# Patient Record
Sex: Female | Born: 1960 | Race: White | Hispanic: No | Marital: Married | Smoking: Never smoker
Health system: Southern US, Community
[De-identification: ages and names within clinical notes are randomized; demographics above are authoritative.]

## PROBLEM LIST (undated history)

## (undated) DIAGNOSIS — F329 Major depressive disorder, single episode, unspecified: Secondary | ICD-10-CM

## (undated) DIAGNOSIS — E785 Hyperlipidemia, unspecified: Secondary | ICD-10-CM

## (undated) DIAGNOSIS — F32A Depression, unspecified: Secondary | ICD-10-CM

## (undated) DIAGNOSIS — I10 Essential (primary) hypertension: Secondary | ICD-10-CM

## (undated) DIAGNOSIS — K7581 Nonalcoholic steatohepatitis (NASH): Secondary | ICD-10-CM

---

## 1998-11-27 ENCOUNTER — Other Ambulatory Visit: Admission: RE | Admit: 1998-11-27 | Discharge: 1998-11-27 | Payer: Self-pay | Admitting: Obstetrics and Gynecology

## 2000-06-06 ENCOUNTER — Other Ambulatory Visit: Admission: RE | Admit: 2000-06-06 | Discharge: 2000-06-06 | Payer: Self-pay | Admitting: Obstetrics and Gynecology

## 2000-08-29 ENCOUNTER — Inpatient Hospital Stay (HOSPITAL_COMMUNITY): Admission: RE | Admit: 2000-08-29 | Discharge: 2000-08-31 | Payer: Self-pay | Admitting: Obstetrics and Gynecology

## 2000-08-29 ENCOUNTER — Encounter (INDEPENDENT_AMBULATORY_CARE_PROVIDER_SITE_OTHER): Payer: Self-pay

## 2001-06-20 ENCOUNTER — Emergency Department (HOSPITAL_COMMUNITY): Admission: EM | Admit: 2001-06-20 | Discharge: 2001-06-20 | Payer: Self-pay | Admitting: *Deleted

## 2001-12-22 ENCOUNTER — Encounter: Admission: RE | Admit: 2001-12-22 | Discharge: 2001-12-22 | Payer: Self-pay | Admitting: Family Medicine

## 2001-12-22 ENCOUNTER — Encounter: Payer: Self-pay | Admitting: Family Medicine

## 2002-01-07 ENCOUNTER — Other Ambulatory Visit: Admission: RE | Admit: 2002-01-07 | Discharge: 2002-01-07 | Payer: Self-pay | Admitting: Pediatrics

## 2002-02-04 ENCOUNTER — Encounter: Admission: RE | Admit: 2002-02-04 | Discharge: 2002-02-04 | Payer: Self-pay | Admitting: Family Medicine

## 2002-02-04 ENCOUNTER — Encounter: Payer: Self-pay | Admitting: Family Medicine

## 2003-02-15 ENCOUNTER — Other Ambulatory Visit: Admission: RE | Admit: 2003-02-15 | Discharge: 2003-02-15 | Payer: Self-pay | Admitting: Obstetrics and Gynecology

## 2004-08-30 ENCOUNTER — Ambulatory Visit (HOSPITAL_COMMUNITY): Admission: RE | Admit: 2004-08-30 | Discharge: 2004-08-30 | Payer: Self-pay | Admitting: Orthopedic Surgery

## 2004-08-30 ENCOUNTER — Ambulatory Visit (HOSPITAL_BASED_OUTPATIENT_CLINIC_OR_DEPARTMENT_OTHER): Admission: RE | Admit: 2004-08-30 | Discharge: 2004-08-30 | Payer: Self-pay | Admitting: Orthopedic Surgery

## 2005-09-03 ENCOUNTER — Other Ambulatory Visit: Admission: RE | Admit: 2005-09-03 | Discharge: 2005-09-03 | Payer: Self-pay | Admitting: Obstetrics and Gynecology

## 2005-10-15 ENCOUNTER — Ambulatory Visit: Payer: Self-pay | Admitting: Internal Medicine

## 2005-10-17 ENCOUNTER — Encounter: Admission: RE | Admit: 2005-10-17 | Discharge: 2005-10-17 | Payer: Self-pay | Admitting: Family Medicine

## 2006-01-16 ENCOUNTER — Encounter: Admission: RE | Admit: 2006-01-16 | Discharge: 2006-01-16 | Payer: Self-pay | Admitting: Family Medicine

## 2008-04-22 ENCOUNTER — Ambulatory Visit: Payer: Self-pay | Admitting: Internal Medicine

## 2008-04-29 ENCOUNTER — Telehealth (INDEPENDENT_AMBULATORY_CARE_PROVIDER_SITE_OTHER): Payer: Self-pay | Admitting: *Deleted

## 2008-06-07 ENCOUNTER — Telehealth: Payer: Self-pay | Admitting: Internal Medicine

## 2009-04-27 ENCOUNTER — Ambulatory Visit (HOSPITAL_BASED_OUTPATIENT_CLINIC_OR_DEPARTMENT_OTHER): Admission: RE | Admit: 2009-04-27 | Discharge: 2009-04-27 | Payer: Self-pay | Admitting: Orthopedic Surgery

## 2010-01-22 ENCOUNTER — Ambulatory Visit (HOSPITAL_COMMUNITY): Admission: RE | Admit: 2010-01-22 | Discharge: 2010-01-22 | Payer: Self-pay | Admitting: Obstetrics and Gynecology

## 2010-03-16 ENCOUNTER — Ambulatory Visit (HOSPITAL_COMMUNITY): Admission: RE | Admit: 2010-03-16 | Discharge: 2010-03-17 | Payer: Self-pay | Admitting: Obstetrics and Gynecology

## 2011-03-15 LAB — DIFFERENTIAL
Basophils Absolute: 0 10*3/uL (ref 0.0–0.1)
Basophils Relative: 1 % (ref 0–1)
Eosinophils Relative: 1 % (ref 0–5)
Lymphocytes Relative: 31 % (ref 12–46)
Monocytes Absolute: 0.6 10*3/uL (ref 0.1–1.0)
Neutro Abs: 4.1 10*3/uL (ref 1.7–7.7)

## 2011-03-15 LAB — CBC
Hemoglobin: 13.5 g/dL (ref 12.0–15.0)
MCHC: 34.4 g/dL (ref 30.0–36.0)
Platelets: 265 10*3/uL (ref 150–400)
Platelets: 334 10*3/uL (ref 150–400)
RBC: 3.64 MIL/uL — ABNORMAL LOW (ref 3.87–5.11)
RDW: 12 % (ref 11.5–15.5)
WBC: 8.8 10*3/uL (ref 4.0–10.5)

## 2011-03-15 LAB — BASIC METABOLIC PANEL
BUN: 13 mg/dL (ref 6–23)
Chloride: 104 mEq/L (ref 96–112)
Creatinine, Ser: 0.89 mg/dL (ref 0.4–1.2)
GFR calc Af Amer: >60 mL/min (ref >60)
GFR calc non Af Amer: >60 mL/min (ref >60)
Potassium: 3.9 mEq/L (ref 3.5–5.1)

## 2011-04-02 LAB — BASIC METABOLIC PANEL
CO2: 27 mEq/L (ref 19–32)
Chloride: 101 mEq/L (ref 96–112)
GFR calc Af Amer: >60 mL/min (ref >60)
Potassium: 3.7 mEq/L (ref 3.5–5.1)

## 2011-04-02 LAB — POCT HEMOGLOBIN-HEMACUE: Hemoglobin: 14.5 g/dL (ref 12.0–15.0)

## 2011-05-07 NOTE — Op Note (Signed)
Sherri Lynch, Sherri Lynch              ACCOUNT NO.:  [id number]   MEDICAL RECORD NO.:  [id number]          PATIENT TYPE:  AMB   LOCATION:  DSC                          FACILITY:  [facility]   PHYSICIAN:  Loreta Ave, M.D. DATE OF BIRTH:  January 01, 1961   DATE OF PROCEDURE:  04/27/2009  DATE OF DISCHARGE:                               OPERATIVE REPORT   PREOPERATIVE DIAGNOSES:  Right shoulder impingement, labral tear, labral  cyst, distal clavicle osteolysis.   POSTOPERATIVE DIAGNOSES:  1. Right shoulder impingement, labral tear, labral cyst, distal      clavicle osteolysis with a moderate amount of partial tearing      biceps tendon origin, undersurface cuff.  2. Also some grade 2, 3, and focal grade 4 changes, inferior glenoid.   PROCEDURES:  1. Right shoulder exam under anesthesia, arthroscopy, debridement of      labrum tear, biceps, rotator cuff glenohumeral joint, articular      cartilage, loose bodies.  2. Bursectomy, acromioplasty, coracoacromial ligament release.  3. Excision of distal clavicle.   SURGEON:  Loreta Ave, MD   ASSISTANT:  Genene Churn. Barry Dienes, PA   ANESTHESIA:  General.   ESTIMATED BLOOD LOSS:  Minimal.   SPECIMENS:  None.   CULTURES:  None.   COMPLICATIONS:  None.   DRESSING:  Sterile compressive with sling.   PROCEDURE:  The patient brought to the operating room, placed on the  operating table in supine position.  After adequate anesthesia had been  obtained, shoulder examined.  Full motion with stable shoulder.  Placed  in beach-chair position on the shoulder positioner, prepped and draped  in usual sterile fashion.  Three portals created in anterior, posterior,  and lateral.  Shoulder entered with blunt obturator.  Arthroscope  introduced.  Shoulder distended and inspected.  A lot more labral  tearing than the scan suggested.  Extensive complex tearing of the  entire labrum all way around.  Debrided, leaving about a half of labrum  at completion.   At the top of the tear, when I entered the biceps origin  with about third of the tendon torn.  This was cleaned out and debrided.  I still had reasonable tendon left and a good attachment and I did not  feel resection or tenodesis indicated now.  Humeral head looked good.  Partial tearing in the crescent region of the cuff debrided.  Grade 3  and 4 changes on the glenoid, mostly it is grade 2 and 3.  Inferior  anterior.  All of this was cleaned out.  All loose bodies removed.  When  that was completed, cannula redirected subacromially.  Marked reactive  bursitis.  Impingement from spurring, grade 4 changes AC joint as well  as anterior acromion.  Bursa resected, cuff debrided.  CA ligament  released with cautery.  Acromioplasty to a type 1 acromion.  Distal  clavicle exposed.  Periarticular spurs with lateral centimeter of  clavicle resected.  At completion, adequacy of decompression confirmed  viewing from all portals.  Instruments and fluid removed.  Portals of  shoulder and bursa injected with Marcaine.  Portals closed with 4-0  nylon.  Sterile compressive dressing applied.  Anesthesia reversed.  Brought to the recovery room.  Tolerated the surgery well.  No  complications.      Loreta Ave, M.D.  Electronically Signed     DFM/MEDQ  D:  04/27/2009  T:  04/28/2009  Job:  [id number]

## 2011-05-10 NOTE — Discharge Summary (Signed)
[facility]  Patient:    Sherri Lynch, Sherri Lynch                     MRN: [id number] Adm. Date:  [id number] Disc. Date: [id number] Attending:  Frederich Balding                           Discharge Summary  ADMISSION DIAGNOSES:          1. 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                               2. Anatomical stress urinary incontinence.  DISCHARGE DIAGNOSES:          1. 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                               2. Anatomical stress urinary incontinence.  PROCEDURES:                   1. Total abdominal hysterectomy.                               2. Culdoplasty.                               3. Retropubic suspension of the bladder using the Burch procedure.                               4. Cystostomy and placement of suprapubic catheter.  HISTORY                       For complete History & Physical, please dictated note.  COURSE IN HOSPITAL:           The patient underwent the above-noted surgery without complications.  Postoperative hemoglobin was 10.9.  She began cramping on her first postop day.  Residuals were under 50 cc.  Subsequently, on her second postop day, the suprapubic catheter was removed.  She had normal voiding function at that time.  Abdomen was soft and nontender.  Bowel sounds were active.  She was passing flatus.  Incision was clear.  She had no active vaginal bleeding.  COMPLICATIONS:                None occurred during her stay in the hospital.  CONDITION ON DISCHARGE:        The patient is discharged home on her second postop day in good condition.  DISPOSITION:                  The patient is to watch for signs of infection, nausea, vomiting.  She will report increasing abdominal or pelvic pain.  She will also report any active vaginal bleeding.  She  is to avoid heavy lifting, vaginal entry, or  driving a car.  DISCHARGE MEDICATIONS:        Tylox as needed for pain, continue on iron sulfate supplementation.  FOLLOWUP:                     Reassess in the office in one week. DD:  08/31/00 TD:  09/01/00 Job: [id number]

## 2011-05-10 NOTE — Op Note (Signed)
Sherri Lynch, Sherri Lynch                        ACCOUNT NO.:  [id number]   MEDICAL RECORD NO.:  [id number]                   PATIENT TYPE:  AMB   LOCATION:  DSC                                  FACILITY:  [facility]   PHYSICIAN:  Loreta Ave, M.D.              DATE OF BIRTH:  25-Aug-1961   DATE OF PROCEDURE:  08/30/2004  DATE OF DISCHARGE:                                 OPERATIVE REPORT   PREOPERATIVE DIAGNOSIS:  Nonunion fracture fifth metatarsal distal shaft,  right foot.   POSTOPERATIVE DIAGNOSIS:  Nonunion fracture fifth metatarsal distal shaft,  right foot.   OPERATIVE PROCEDURE:  Open reduction and internal fixation fifth metatarsal  nonunion with a cannulated 4.5 screw and cerclage of suture.   SURGEON:  Loreta Ave, M.D.   ASSISTANT:  Arlys John D. Petrarca, P.A.-C.   ANESTHESIA:  General.   ESTIMATED BLOOD LOSS:  Minimal.   TOURNIQUET TIME:  45 minutes.   SPECIMENS:  None.   CULTURES:  None.   COMPLICATIONS:  None.   DRESSINGS:  Soft compressive with a wooden shoe.   PROCEDURE:  The patient was brought to the operating room and placed on the  operating table in supine position.  After adequate anesthesia had been  obtained, a tourniquet was applied to the upper aspect of the right leg.  Prepped and draped in the usual sterile fashion.  Exsanguinated with  elevation of Esmarch and tourniquet inflated to 300 mmHg.  Fluoroscopic  guidance throughout.  Longitudinal incision over the fracture site, distal  shaft, right fifth metatarsal.  Subperiosteal exposure of the nonunion was  down of fibrous debris so that it could be approximated to anatomic  alignment with good bone on bone contact.  Once this was achieved, a  longitudinal incision at the base of the fifth metatarsal.  A guide-wire was  passed from the proximal end of the shaft across the shaft, across the  reduced fracture, and into the distal aspect.  Measured and then pre-drilled  for a 60 mm lag screw.   The screw was then passed down the shaft, across the  fracture, and well down into the head of the fifth metatarsal with excellent  capture, fixation, alignment, and compression clinically as well as on  fluoroscopic views.  Care taken not to enter the joint.  Once this was  complete, to augment stability, I did two cerclages of #1 Vicryl around the  spiral nonunion.  Once that was achieved, I had excellent fixation and  alignment confirmed visually and fluoroscopically.  Both wounds were  irrigated and closed with Vicryl and then nylon.  The margins of the wound  were injected with Marcaine.  Sterile compressive dressing applied.  Tourniquet deflated and removed.  Wooden shoe applied.  Anesthesia reversed.  Brought to the recovery room.  Tolerated the surgery well without  complications.  Loreta Ave, M.D.    DFM/MEDQ  D:  08/30/2004  T:  08/30/2004  Job:  [id number]

## 2011-05-10 NOTE — Op Note (Signed)
[facility]  Patient:    Sherri Lynch, Sherri Lynch                     MRN: [id number] Proc. Date: 08/29/00 Adm. Date:  [id number] Disc. Date: [id number] Attending:  Frederich Balding                           Operative Report  PREOPERATIVE DIAGNOSES:       1. Pelvic endometriosis and uterine adenomyosis.                               2. Anatomical stress urinary incontinence.  POSTOPERATIVE DIAGNOSES:      1. Pelvic endometriosis and uterine adenomyosis.                               2. Anatomical stress urinary incontinence.  OPERATION:                    1. Total abdominal hysterectomy with Burch                                  retropubic suspension of the bladder.                               2. Cystoscopy with placement of suprapubic                                  catheter.                               3. Culdoplasty.  SURGEON:                      Juluis Mire, M.D.  ASSISTANT:                    Trevor Iha, M.D.  ANESTHESIA:                   General endotracheal.  ESTIMATED BLOOD LOSS:         200-300 cc.  PACKS AND DRAINS:             None.  INTRAOPERATIVE BLOOD REPLACED:  None.  COMPLICATIONS:                None.  INDICATIONS:                  As dictated in the history and physical.  DESCRIPTION OF PROCEDURE:     Patient taken to the OR and placed in the supine position.  After a satisfactory level of general endotracheal anesthesia was obtained, the patient was placed in the dorsal lithotomy position using the Allen stirrups.  The abdomen, perineum, and vagina were prepped out with Betadine.  A three-way Foley was placed to straight drain.  The patient was then draped out for surgery.  A low transverse skin incision was made with a knife and carried through the subcutaneous tissue.  The fascia was incised sharply and the fascia extended laterally.  The  fascia was then taken off the muscle superiorly and inferiorly.   Rectus muscles were separated in the midline.  The peritoneum was entered sharply, and the incision of the peritoneum was extended both superiorly and inferiorly.  There was no evidence of injury to adjacent organs.  Palpation revealed the kidneys to be of normal size and shape.  The appendix was visualized and noted to be unremarkable. The liver was smooth and the gallbladder nonpalpable.  An OConnor-OSullivan retractor was put into place, and the bowel contents were packed superiorly out of the pelvic cavity.  The uterus was visualized, was upper limits of normal size.  The tubes and ovaries were unremarkable.  The left ovary had a small superficial implant of endometriosis that was cauterized.  There were numerous implants of endometriosis in the cul-de-sac area that seemed to be mainly superficial.  Next, the uterus was clamped on both sides with Kelly clamps and elevated.  The right round ligament was clamped, cut, and suture ligated with 0 Vicryl.  The right utero-ovarian pedicle was isolated, clamped, cut, and doubly ligated, first with a free tie of 0 Vicryl and then a suture ligature of 0 Vicryl.  Next, the bladder flap was developed.  The left round ligament was clamped, cut, and suture ligated with 0 Vicryl.  The left utero-ovarian pedicle was isolated and clamped, cut, and doubly ligated, first with a free tie of 0 Vicryl, then a suture ligature of 0 Vicryl.  The bladder was further dissected off the lower uterine and cervical segment.  The uterine vessels were skeletonized and clamped, cut, and suture ligated with 0 Vicryl. Using the clamp, cut, and tie technique with suture ligature of 0 Vicryl, the parametrium was serially separated from the sides of the uterus down to the vaginal angles.  The vaginal angles were then clamped and cut, the intervening vaginal mucosa was incised, and the uterus was passed off the operative field. The vaginal angles were secured with two suture  ligatures of 0 Vicryl.  The intervening vaginal mucosa was closed with interrupted figure-of-eights of 0 Vicryl.  Areas of the bladder flap that were bleeding were all controlled using right angles and free ties of 3-0 Vicryl.  We also did use cautery. Next, she did have a deep cul-de-sac.  The uterosacral ligaments were brought together in the midline with interrupted sutures of 0 Vicryl.  This was continued toward the rectum.  We therefore were able to obliterate the cul-de-sac and provide support against enteroceles.  The ureters appeared to be uninvolved in the operative procedure.  Next, the ovaries were elevated to the round ligaments with suture ligatures of 0 Vicryl.  The pelvic cavity was thoroughly irrigated, and hemostasis was excellent.  Urine output remained clear and adequate.  All packs and self-retaining retractor were removed.  The peritoneum was closed with a running suture of 2-0 Vicryl.  Attention was now turned to the retropubic suspension.  The space of Retzius was easily developed.  A gloved hand was placed in the vaginal vault with good elevation.  The urethrovesical angle was identified.  Two sutures of 2-0 Prolene were put in place on each side of the urethrovesical angle and secured to Coopers ligament.  Next, two sutures were placed out laterally to the first two, again using 0 Prolene.  Again, these were attached to Coopers ligament.  All these were secured with good elevation of the urethrovesical angle and the bladder.  The gloved hand was then taken out of  the vaginal vault.  We had good hemostasis.  It is noted that during the procedure, some venous sinuses were bleeding and were controlled with right angles and free ties of 3-0 Vicryl.  Next, the bladder was distended due to three-way Foley.  A cystotomy site was made in the bladder fundus, and the cystoscope was introduced.  Visualization revealed no evidence of suture in the bladder.  We did give the  patient indigo carmine, and blue dye was filling from both ureteral orifices bilaterally. Next, the cystoscope was removed.  The cystotomy was closed, first with a  suture ligature of 3-0 Vicryl closing the mucosa, then the muscularis was brought together with 3-0 chromic, and a third layer was brought together with 3-0 chromic.  The bladder was re-distended, and no leakage was noted.  A Bonnano catheter was put in place and secured to the skin.  At this point in time, muscle was reapproximated with 3-0 Vicryl, fascia closed with a running suture of 0 PDS.  Skin was closed with staples and Steri-Strips.  Sponge, instrument, and needle count was verified as correct by the circulating nurse x 2.  Suprapubic drainage was adequate.  The patient was taken out of the dorsal lithotomy position and was extubated, alert, and transferred to the recovery room in good condition. DD:  08/30/00 TD:  09/01/00 Job: [id number]

## 2011-05-10 NOTE — H&P (Signed)
[facility]  Patient:    Sherri Lynch, Sherri Lynch                       MRN: [id number] Adm. Date:  08/29/00 Attending:  Juluis Mire, M.D.                         History and Physical  HISTORY OF PRESENT ILLNESS:   Patient is a 50 year old gravida 5 para 4 abortion 1 married white female, presents for a total abdominal hysterectomy along with retropubic suspension of the bladder for management of symptomatic pelvic endometriosis and uterine adenomyosis.  She also does have anatomical stress urinary incontinence.  In relation to the present admission, patient has had longstanding history with menstrual disorders.  Her cycles at the present time occur on a regular basis, but she is having problems with increasing flow.  Periods will last anywhere from 7-12 days with clots.  She reports changing pads and tampons every 1-2 hours.  Her hemoglobin has been slightly lower in the past due to this significant flow.  Along with this, she has been having increasing pain and dysmenorrhea.  Over-the-counter agents have been unresponsive.  She had a previous laparoscopy done in 1997 with findings of pelvic endometriosis and uterine adenomyosis.  The option of hormonal agents have been somewhat contraindicated due to patients hypertensive disorder.  In view of the continued issues, she does present for total abdominal hysterectomy.  Patient has also been having trouble with increasing stress urinary incontinence.  Subsequent evaluation in the office revealed a positive Q-tip test.  She had normal bladder volume studies with no evidence of uninhibited contractions.  She did demonstrate leakage of urine with coughing while standing.  In view of these findings, she is to also undergo retropubic suspension of the bladder with placement of a suprapubic catheter under cystoscopy.  ALLERGIES:                    Patient is allergic to CECLOR and PENICILLIN.  MEDICATIONS:                   Paxil.  PAST MEDICAL HISTORY:         Usual childhood diseases without any significant sequela.  PREVIOUS SURGICAL HISTORY:    As noted, she had the previous diagnostic laparoscopy with laser standby in 1997 for evaluation of pelvic endometriosis.  OBSTETRICAL HISTORY:          She has had four spontaneous vaginal deliveries and one TAB.  FAMILY HISTORY:               Noncontributory.  SOCIAL HISTORY:               No tobacco or alcohol use.  REVIEW OF SYSTEMS:            Noncontributory.  PHYSICAL EXAMINATION:  VITAL SIGNS:                  Patient is afebrile, stable vital signs.  HEENT:                        Patient is normocephalic.  Pupils equal, round, and reactive to light and accommodation.  Extraocular movements are intact. Sclerae and conjunctivae were clear.  Oropharynx clear.  NECK:  Without thyromegaly.  BREASTS:                      No discrete masses.  LUNGS:                        Clear.  CARDIOVASCULAR:               Regular rhythm and rate, no murmurs or gallops.  ABDOMEN:                      Benign.  PELVIC:                       Normal external genitalia, vaginal mucosa is clear.  Cervix unremarkable.  Uterus normal size, shape, and contour. Moderate cul-de-sac tenderness and uterine tenderness.  Adnexa unremarkable.  EXTREMITIES:                  Trace edema.  NEUROLOGIC:                   Grossly within normal limits.  IMPRESSION:                   1. Symptomatic pelvic endometriosis and uterine                                  adenomyosis.                               2. Anatomical stress urinary incontinence.  PLAN:                         Patient to undergo total abdominal hysterectomy. The ovaries will be left in place.  She does understand about persistent pelvic pain and discomfort secondary to the remaining ovaries, and the possible need for surgical management later.  In terms of the bladder  issue, we discussed the success rate of 80-90%.  She does understand that there can be recurrent issues with urinary leakage, requiring further management later on.  She does understand the need for placement of the suprapubic catheter and resultant bladder retraining.  Long-term catheterization is possible.  There are rare cases where the bladder suspension has to be taken down so the patient can void.  The risks of surgery have been discussed, including anesthetic concerns; the risk of infection; the risk of hemorrhage that can necessitate transfusion, with the risk of AIDS or hepatitis; the risk of injury to adjacent organs, including bladder, bowel, or ureters that could require further exploratory surgery; the risk of deep venous thrombosis and pulmonary embolus.  Patient professed an understanding of indications and risks. DD:  08/29/00 TD:  08/29/00 Job: [id number]

## 2013-01-08 ENCOUNTER — Other Ambulatory Visit (HOSPITAL_COMMUNITY): Payer: Self-pay | Admitting: Obstetrics and Gynecology

## 2013-01-08 DIAGNOSIS — E041 Nontoxic single thyroid nodule: Secondary | ICD-10-CM

## 2013-01-08 DIAGNOSIS — Z09 Encounter for follow-up examination after completed treatment for conditions other than malignant neoplasm: Secondary | ICD-10-CM

## 2013-01-13 ENCOUNTER — Ambulatory Visit (HOSPITAL_COMMUNITY)
Admission: RE | Admit: 2013-01-13 | Discharge: 2013-01-13 | Disposition: A | Discharge status: Home / Self Care | Payer: BC Managed Care – PPO | Source: Ambulatory Visit | Attending: Obstetrics and Gynecology | Admitting: Obstetrics and Gynecology

## 2013-01-13 DIAGNOSIS — E041 Nontoxic single thyroid nodule: Secondary | ICD-10-CM

## 2013-01-13 DIAGNOSIS — Z09 Encounter for follow-up examination after completed treatment for conditions other than malignant neoplasm: Secondary | ICD-10-CM

## 2013-01-13 DIAGNOSIS — E042 Nontoxic multinodular goiter: Secondary | ICD-10-CM | POA: Insufficient documentation

## 2015-01-26 ENCOUNTER — Emergency Department (HOSPITAL_COMMUNITY): Payer: Medicaid Other

## 2015-01-26 ENCOUNTER — Inpatient Hospital Stay (HOSPITAL_COMMUNITY): Payer: Medicaid Other

## 2015-01-26 ENCOUNTER — Observation Stay (HOSPITAL_COMMUNITY)
Admission: EM | Admit: 2015-01-26 | Discharge: 2015-01-27 | Disposition: A | Discharge status: Other Acute Inpatient Hospital | Payer: Medicaid Other | Attending: Internal Medicine | Admitting: Internal Medicine

## 2015-01-26 ENCOUNTER — Encounter (HOSPITAL_COMMUNITY): Payer: Self-pay | Admitting: Emergency Medicine

## 2015-01-26 DIAGNOSIS — G43909 Migraine, unspecified, not intractable, without status migrainosus: Secondary | ICD-10-CM | POA: Diagnosis present

## 2015-01-26 DIAGNOSIS — K7581 Nonalcoholic steatohepatitis (NASH): Secondary | ICD-10-CM | POA: Insufficient documentation

## 2015-01-26 DIAGNOSIS — R519 Headache, unspecified: Secondary | ICD-10-CM

## 2015-01-26 DIAGNOSIS — I161 Hypertensive emergency: Secondary | ICD-10-CM | POA: Diagnosis present

## 2015-01-26 DIAGNOSIS — R4701 Aphasia: Secondary | ICD-10-CM | POA: Diagnosis not present

## 2015-01-26 DIAGNOSIS — R4182 Altered mental status, unspecified: Secondary | ICD-10-CM | POA: Diagnosis not present

## 2015-01-26 DIAGNOSIS — E785 Hyperlipidemia, unspecified: Secondary | ICD-10-CM | POA: Diagnosis not present

## 2015-01-26 DIAGNOSIS — R51 Headache: Secondary | ICD-10-CM

## 2015-01-26 DIAGNOSIS — I10 Essential (primary) hypertension: Secondary | ICD-10-CM | POA: Insufficient documentation

## 2015-01-26 DIAGNOSIS — G458 Other transient cerebral ischemic attacks and related syndromes: Secondary | ICD-10-CM

## 2015-01-26 DIAGNOSIS — G459 Transient cerebral ischemic attack, unspecified: Secondary | ICD-10-CM | POA: Diagnosis present

## 2015-01-26 HISTORY — DX: Essential (primary) hypertension: I10

## 2015-01-26 HISTORY — DX: Hyperlipidemia, unspecified: E78.5

## 2015-01-26 HISTORY — DX: Depression, unspecified: F32.A

## 2015-01-26 HISTORY — DX: Nonalcoholic steatohepatitis (NASH): K75.81

## 2015-01-26 HISTORY — DX: Major depressive disorder, single episode, unspecified: F32.9

## 2015-01-26 LAB — COMPREHENSIVE METABOLIC PANEL
ALK PHOS: 91 U/L (ref 39–117)
ALT: 23 U/L (ref 0–35)
ANION GAP: 11 (ref 5–15)
AST: 27 U/L (ref 0–37)
Albumin: 4.7 g/dL (ref 3.5–5.2)
BUN: 19 mg/dL (ref 6–23)
CHLORIDE: 100 mmol/L (ref 96–112)
CO2: 26 mmol/L (ref 19–32)
CREATININE: 0.87 mg/dL (ref 0.50–1.10)
Calcium: 9.6 mg/dL (ref 8.4–10.5)
GFR calc non Af Amer: 75 mL/min — ABNORMAL LOW (ref >90)
GFR, EST AFRICAN AMERICAN: 87 mL/min — AB (ref >90)
Glucose, Bld: 93 mg/dL (ref 70–99)
POTASSIUM: 3.5 mmol/L (ref 3.5–5.1)
SODIUM: 137 mmol/L (ref 135–145)
TOTAL PROTEIN: 7.7 g/dL (ref 6.0–8.3)
Total Bilirubin: 0.9 mg/dL (ref 0.3–1.2)

## 2015-01-26 LAB — CBC
HCT: 40.1 % (ref 36.0–46.0)
HEMOGLOBIN: 14.2 g/dL (ref 12.0–15.0)
MCH: 30.3 pg (ref 26.0–34.0)
MCHC: 35.4 g/dL (ref 30.0–36.0)
MCV: 85.5 fL (ref 78.0–100.0)
Platelets: 295 10*3/uL (ref 150–400)
RBC: 4.69 MIL/uL (ref 3.87–5.11)
RDW: 12.4 % (ref 11.5–15.5)
WBC: 9.9 10*3/uL (ref 4.0–10.5)

## 2015-01-26 LAB — I-STAT TROPONIN, ED: TROPONIN I, POC: 0 ng/mL (ref 0.00–0.08)

## 2015-01-26 LAB — URINALYSIS, ROUTINE W REFLEX MICROSCOPIC
BILIRUBIN URINE: NEGATIVE
Glucose, UA: NEGATIVE mg/dL
HGB URINE DIPSTICK: NEGATIVE
Ketones, ur: NEGATIVE mg/dL
Leukocytes, UA: NEGATIVE
Nitrite: NEGATIVE
PH: 7 (ref 5.0–8.0)
Protein, ur: NEGATIVE mg/dL
Specific Gravity, Urine: 1.008 (ref 1.005–1.030)
UROBILINOGEN UA: 0.2 mg/dL (ref 0.0–1.0)

## 2015-01-26 LAB — DIFFERENTIAL
Basophils Absolute: 0 10*3/uL (ref 0.0–0.1)
Basophils Relative: 0 % (ref 0–1)
Eosinophils Absolute: 0.2 10*3/uL (ref 0.0–0.7)
Eosinophils Relative: 2 % (ref 0–5)
LYMPHS ABS: 3.5 10*3/uL (ref 0.7–4.0)
Lymphocytes Relative: 35 % (ref 12–46)
Monocytes Absolute: 0.6 10*3/uL (ref 0.1–1.0)
Monocytes Relative: 6 % (ref 3–12)
Neutro Abs: 5.7 10*3/uL (ref 1.7–7.7)
Neutrophils Relative %: 57 % (ref 43–77)

## 2015-01-26 LAB — CBG MONITORING, ED: Glucose-Capillary: 100 mg/dL — ABNORMAL HIGH (ref 70–99)

## 2015-01-26 LAB — PROTIME-INR
INR: 0.97 (ref 0.00–1.49)
PROTHROMBIN TIME: 13 s (ref 11.6–15.2)

## 2015-01-26 LAB — APTT: aPTT: 28 seconds (ref 24–37)

## 2015-01-26 MED ORDER — ENOXAPARIN SODIUM 30 MG/0.3ML ~~LOC~~ SOLN
30.0000 mg | SUBCUTANEOUS | Status: DC
  Administered 2015-01-27: 30 mg via SUBCUTANEOUS
  Filled 2015-01-26: qty 0.3

## 2015-01-26 MED ORDER — MORPHINE SULFATE 2 MG/ML IJ SOLN
2.0000 mg | INTRAMUSCULAR | Status: DC | PRN
  Filled 2015-01-26: qty 2

## 2015-01-26 MED ORDER — CLEVIDIPINE BUTYRATE 0.5 MG/ML IV EMUL
1.0000 mg/h | INTRAVENOUS | Status: DC
  Filled 2015-01-26: qty 50

## 2015-01-26 MED ORDER — SENNOSIDES-DOCUSATE SODIUM 8.6-50 MG PO TABS
1.0000 | ORAL_TABLET | Freq: Every evening | ORAL | Status: DC | PRN
  Filled 2015-01-26: qty 1

## 2015-01-26 MED ORDER — KETOROLAC TROMETHAMINE 30 MG/ML IJ SOLN
30.0000 mg | Freq: Once | INTRAMUSCULAR | Status: AC
  Administered 2015-01-26: 30 mg via INTRAVENOUS
  Filled 2015-01-26: qty 1

## 2015-01-26 MED ORDER — STROKE: EARLY STAGES OF RECOVERY BOOK
Freq: Once | Status: AC
  Administered 2015-01-27: 06:00:00
  Filled 2015-01-26 (×2): qty 1

## 2015-01-26 MED ORDER — NICARDIPINE HCL IN NACL 20-0.86 MG/200ML-% IV SOLN
3.0000 mg/h | Freq: Once | INTRAVENOUS | Status: AC
  Administered 2015-01-26: 3 mg/h via INTRAVENOUS
  Filled 2015-01-26: qty 200

## 2015-01-26 NOTE — ED Notes (Signed)
Spoke with Dr. Alvester MorinNewton about pt being off of Cardene to go to floor. Dr. Alvester MorinNewton stated that pt can come off of drip but if systolic pressure goes above 180 then she is to be placed back on. [facility].

## 2015-01-26 NOTE — ED Notes (Addendum)
Below orders not completed by EW. 

## 2015-01-26 NOTE — ED Notes (Addendum)
Shakeena RN from 4N called to inform this RN that pts bed assignment may be switched per pt placement to keep from having to transfer pt more than once.

## 2015-01-26 NOTE — ED Provider Notes (Signed)
  Physical Exam  BP 187/115 mmHg  Pulse 73  Temp(Src) 97.9 F (36.6 C) (Oral)  Resp 17  Wt 170 lb (77.111 kg)  SpO2 96%  Physical Exam  ED Course  Procedures  [facility] Patient had acute onset of R sided weakness, numbness, trouble speaking. Symptoms improving. CT head showed no obvious bleed. Transferred to see neuro. Dr. Roseanne RenoStewart saw patient. Doesn't recommend TPA. Thinks likely TIA vs complex migraine vs hypertensive urgency. Patient on cardene drip and given migraine cocktail. Will admit.   Richardean Canalavid H Yony Roulston, MD 01/26/15 2016

## 2015-01-26 NOTE — ED Notes (Signed)
[facility] at bedside 

## 2015-01-26 NOTE — H&P (Signed)
Hospitalist Admission History and Physical  Patient name: Sherri Lynch Medical record number: [id number] Date of birth: Sep 19, 1961 Age: 54 y.o. Gender: female  Primary Care Provider: Willow Ora, MD  Chief Complaint: TIA, hypertensive emergency, migraine   History of Present Illness:This is a 54 y.o. year old female with significant past medical history of migraine, HTN, NAFLD presenting with TIA, hypertensive emergency, migraine. Pt states that she developed persistent headache earlier today. Pt states that she saw her PCP. BP was in 220s per pt. Pt states that she was given clonidine in office as well as Rx for ARB. Pt states that she had not filled Rx.  Later in the day, during dinner, she developed R sided weakness, HA and R sided hemiparesis. Pt states that she had a similar episode 1 month ago w/ sxs lasting approx 30 mins. Does report high dose NSAID use over the past month for MSK pain-R wrist.Pt  went to [facility]. Was evaluated as a code stroke and subsequently directed to [facility]. Neuro consulted on arrival. Felt not to be a tPa candidate. Deficits resolved by time of MD eval. Also BPs in 180s/110s.  Pt given migraine cocktail as well as started on cardene gtt. Head CT no acute abnormality. CBC, CMET WNL. Trop neg x 1. EKG NSR.   Assessment and Plan: Sherri Lynch is a 54 y.o. year old female presenting with TIA, hypertensive emergency, migraine   Active Problems:   TIA (transient ischemic attack)   Hypertensive emergency   Migraine headache   1- Hemiparesis  -transient  -DDx includes TIA, hypertensive emergency, complex miigraine -cont cardene gtt -s/p migraine cocktail  -pain control -TIA workup including MRI, MRA, 2D ECHO, carotid dopplers, risk stratification labs -ASA   2-Hypertensive Emergency  -Markedly elevated BPs on presentation w/ TIA sxs -head CT, trop, EKG WNL -cont cardene drip  -titrate to BPs around 160 (avoid >25% decrease than baseline as to avoid  hypoperfusion if at all possible)  3-Migraine -s/p migraine cocktail  -?complex migraine  -pain control -f/u neuro recs   FEN/GI: NPO pending bedside swallow eval  Prophylaxis: lovenox  Disposition: pending further evaluation  Code Status:Full Code    Patient Active Problem List   Diagnosis Date Noted  . TIA (transient ischemic attack) 01/26/2015  . Hypertensive emergency 01/26/2015  . Migraine headache 01/26/2015   Past Medical History: Past Medical History  Diagnosis Date  . Hypertension   . Hyperlipidemia   . Depression   . NASH (nonalcoholic steatohepatitis)     Past Surgical History: History reviewed. No pertinent past surgical history.  Social History: History   Social History  . Marital Status: Married    Spouse Name: N/A    Number of Children: N/A  . Years of Education: N/A   Social History Main Topics  . Smoking status: Never Smoker   . Smokeless tobacco: None  . Alcohol Use: No  . Drug Use: None  . Sexual Activity: None   Other Topics Concern  . None   Social History Narrative  . None    Family History: No family history on file.  Allergies: Allergies  Allergen Reactions  . Ceclor [Cefaclor]   . Penicillins     Current Facility-Administered Medications  Medication Dose Route Frequency Provider Last Rate Last Dose  .  stroke: mapping our early stages of recovery book   Does not apply Once Doree Albee, MD      . enoxaparin (LOVENOX) injection 30 mg  30 mg Subcutaneous  Q24H Doree AlbeeSteven Gor Vestal, MD      . senna-docusate (Senokot-S) tablet 1 tablet  1 tablet Oral QHS PRN Doree AlbeeSteven Shanoah Asbill, MD       Current Outpatient Prescriptions  Medication Sig Dispense Refill  . losartan-hydrochlorothiazide (HYZAAR) 50-12.5 MG per tablet Take 1 tablet by mouth daily.      Review Of Systems: 12 point ROS negative except as noted above in HPI.  Physical Exam: Filed Vitals:   01/26/15 1915  BP: 187/115  Pulse: 73  Temp:   Resp: 17    General: alert  and cooperative HEENT: PERRLA and extra ocular movement intact Heart: S1, S2 normal, no murmur, rub or gallop, regular rate and rhythm Lungs: clear to auscultation, no wheezes or rales and unlabored breathing Abdomen: abdomen is soft without significant tenderness, masses, organomegaly or guarding Extremities: extremities normal, atraumatic, no cyanosis or edema Skin:no rashes Neurology: normal without focal findings  Labs and Imaging: Lab Results  Component Value Date/Time   NA 137 01/26/2015 06:57 PM   K 3.5 01/26/2015 06:57 PM   CL 100 01/26/2015 06:57 PM   CO2 26 01/26/2015 06:57 PM   BUN 19 01/26/2015 06:57 PM   CREATININE 0.87 01/26/2015 06:57 PM   GLUCOSE 93 01/26/2015 06:57 PM   Lab Results  Component Value Date   WBC 9.9 01/26/2015   HGB 14.2 01/26/2015   HCT 40.1 01/26/2015   MCV 85.5 01/26/2015   PLT 295 01/26/2015    Ct Head (brain) Wo Contrast  01/26/2015   CLINICAL DATA:  Code stroke. Right-sided weakness, acute onset. Initial encounter.  EXAM: CT HEAD WITHOUT CONTRAST  TECHNIQUE: Contiguous axial images were obtained from the base of the skull through the vertex without intravenous contrast.  COMPARISON:  CT of the paranasal sinuses performed 10/17/2005  FINDINGS: There is no evidence of acute infarction, mass lesion, or intra- or extra-axial hemorrhage on CT.  Mild periventricular white matter change likely reflects small vessel ischemic microangiopathy.  The posterior fossa, including the cerebellum, brainstem and fourth ventricle, is within normal limits. The third and lateral ventricles, and basal ganglia are unremarkable in appearance. The cerebral hemispheres are symmetric in appearance, with normal gray-white differentiation. No mass effect or midline shift is seen.  There is no evidence of fracture; visualized osseous structures are unremarkable in appearance. The orbits are within normal limits. The paranasal sinuses and mastoid air cells are well-aerated. No  significant soft tissue abnormalities are seen.  IMPRESSION: 1. No acute intracranial pathology seen on CT. 2. Mild small vessel ischemic microangiopathy noted.  These results were called by telephone at the time of interpretation on 01/26/2015 at 6:57 pm to Dr. Lorre NickANTHONY ALLEN, who verbally acknowledged these results.   Electronically Signed   By: Roanna RaiderJeffery  Chang M.D.   On: 01/26/2015 18:58           Doree AlbeeSteven Seraphim Trow MD  Pager: [phone]

## 2015-01-26 NOTE — ED Notes (Signed)
Cleviprex order discontinued d/t delay in being sent from pharmacy. Dr. Freida BusmanAllen made aware and states that Cardene may be given, order placed and given.

## 2015-01-26 NOTE — ED Notes (Signed)
Pt was at dinner with her husband when she started having weakness, numbness to right side of face, slurred speech and confusion.  Pt was seen at PCP today had elevated BP, pt medications was switched.

## 2015-01-26 NOTE — ED Provider Notes (Signed)
CSN: [id number]     Arrival date & time 01/26/15  1830 History   First MD Initiated Contact with Patient 01/26/15 1838     Chief Complaint  Patient presents with  . Altered Mental Status  . Code Stroke  . Aphasia     (Consider location/radiation/quality/duration/timing/severity/associated sxs/prior Treatment) HPI Comments: Patient here complaining of acute onset of trouble speaking with right-sided upper and lower extremity weakness. She also was confused as well too with a mild headache. Went to her doctor today and had her blood pressure medication adjusted. No vomiting noted. Unable to ambulate without becoming off balance. Denies any chest pain or shortness of breath. No abdominal pain. No visual changes. Denies any neck pain. Symptoms persistent and no treatment use prior to arrival.  Patient is a 54 y.o. female presenting with altered mental status. The history is provided by the patient.  Altered Mental Status   Past Medical History  Diagnosis Date  . Hypertension   . Hyperlipidemia   . Depression   . NASH (nonalcoholic steatohepatitis)    History reviewed. No pertinent past surgical history. No family history on file. History  Substance Use Topics  . Smoking status: Never Smoker   . Smokeless tobacco: Not on file  . Alcohol Use: No   OB History    No data available     Review of Systems  All other systems reviewed and are negative.     Allergies  Ceclor and Penicillins  Home Medications   Prior to Admission medications   Medication Sig Start Date End Date Taking? Authorizing Provider  losartan-hydrochlorothiazide (HYZAAR) 50-12.5 MG per tablet Take 1 tablet by mouth daily.  01/26/15  Yes Historical Provider, MD   BP 200/120 mmHg  Pulse 91  Temp(Src) 97.5 F (36.4 C) (Oral)  Resp 16  SpO2 96% Physical Exam  Constitutional: She is oriented to person, place, and time. She appears well-developed and well-nourished.  Non-toxic appearance. No distress.   HENT:  Head: Normocephalic and atraumatic.  Eyes: Conjunctivae, EOM and lids are normal. Pupils are equal, round, and reactive to light.  Neck: Normal range of motion. Neck supple. No tracheal deviation present. No thyroid mass present.  Cardiovascular: Normal rate, regular rhythm and normal heart sounds.  Exam reveals no gallop.   No murmur heard. Pulmonary/Chest: Effort normal and breath sounds normal. No stridor. No respiratory distress. She has no decreased breath sounds. She has no wheezes. She has no rhonchi. She has no rales.  Abdominal: Soft. Normal appearance and bowel sounds are normal. She exhibits no distension. There is no tenderness. There is no rebound and no CVA tenderness.  Musculoskeletal: Normal range of motion. She exhibits no edema or tenderness.  Neurological: She is alert and oriented to person, place, and time. She has normal strength. A sensory deficit is present. No cranial nerve deficit. GCS eye subscore is 4. GCS verbal subscore is 5. GCS motor subscore is 6.  Subjective decreased sensation right upper right lower extremity. No facial symmetry noted. Speech is not slurred. Negative Romberg. No nystagmus noted. Strength normal in both upper extremities  Skin: Skin is warm and dry. No abrasion and no rash noted.  Psychiatric: Her speech is normal and behavior is normal. Her affect is blunt.  Nursing note and vitals reviewed.   ED Course  Procedures (including critical care time) Labs Review Labs Reviewed  PROTIME-INR  APTT  CBC  DIFFERENTIAL  COMPREHENSIVE METABOLIC PANEL  CBG MONITORING, ED  Rosezena SensorI-STAT TROPOININ, ED  Imaging Review No results found.   EKG Interpretation   Date/Time:  Thursday January 26 2015 18:37:42 EST Ventricular Rate:  89 PR Interval:  167 QRS Duration: 104 QT Interval:  405 QTC Calculation: 493 R Axis:   -32 Text Interpretation:  Sinus rhythm Incomplete RBBB and LAFB RSR' in V1 or  V2, right VCD or RVH Left ventricular  hypertrophy Borderline prolonged QT  interval No significant change since last tracing Confirmed by Leva Baine  MD,  Kaedon Fanelli ([id number]) on 01/26/2015 6:57:32 PM      MDM   Final diagnoses:  None    Patient was last seen normal was approximately 30 minutes prior to arrival. States that her symptoms are improving. With neurologist on call, Dr. Thad Ranger, patient be transferred to [facility]. Due to her improving symptoms she is not a TPA candidate at this time. Patient's NIH stroke scale was 2. Patient started on Cardene for her high blood pressure.  CRITICAL CARE Performed by: Toy Baker Total critical care time: 45 Critical care time was exclusive of separately billable procedures and treating other patients. Critical care was necessary to treat or prevent imminent or life-threatening deterioration. Critical care was time spent personally by me on the following activities: development of treatment plan with patient and/or surrogate as well as nursing, discussions with consultants, evaluation of patient's response to treatment, examination of patient, obtaining history from patient or surrogate, ordering and performing treatments and interventions, ordering and review of laboratory studies, ordering and review of radiographic studies, pulse oximetry and re-evaluation of patient's condition.    Toy Baker, MD 01/26/15 [phone]

## 2015-01-26 NOTE — Consult Note (Signed)
Referring Physician: Cheri Rous    Chief Complaint: Acute onset of speech difficulty with right-sided numbness and facial droop.  HPI: Sherri Lynch is an 54 y.o. female with a history of hypertension with noncompliance with taking medication, hyperlipidemial, Ehlers-Danlos syndrome, nonalcoholic steatohepatitis, and migraine headaches, who brought to the emergency room initially at [facility]. Blood pressure was noted to be markedly elevated at 219/162. Facial droop as well as speech difficulty were noted. NIH stroke score initially was 4. CT scan of her head showed no acute intracranial abnormality. Patient gives a history of similar episode several months ago right side visual changes and facial droop as well as speech difficulty with associated severe headache. She has a moderately severe headache with current symptoms. She was transferred to [facility] and code stroke status. Deficits subsequently resolved without intervention. NIH stroke score here was 0. Patient has required IV Cardene drip for management of hypertensive emergency.  LSN: 6 PM on 01/26/2015 tPA Given: No: Deficits resolved. mRankin:  Past Medical History  Diagnosis Date  . Hypertension   . Hyperlipidemia   . Depression   . NASH (nonalcoholic steatohepatitis)     No family history on file.   Medications: I have reviewed the patient's current medications.  ROS: History obtained from spouse and the patient  General ROS: negative for - chills, fatigue, fever, night sweats, weight gain or weight loss Psychological ROS: negative for - behavioral disorder, hallucinations, memory difficulties, mood swings or suicidal ideation Ophthalmic ROS: negative for - blurry vision, double vision, eye pain or loss of vision ENT ROS: negative for - epistaxis, nasal discharge, oral lesions, sore throat, tinnitus or vertigo Allergy and Immunology ROS: negative for - hives or itchy/watery eyes Hematological and  Lymphatic ROS: negative for - bleeding problems, bruising or swollen lymph nodes Endocrine ROS: negative for - galactorrhea, hair pattern changes, polydipsia/polyuria or temperature intolerance Respiratory ROS: negative for - cough, hemoptysis, shortness of breath or wheezing Cardiovascular ROS: negative for - chest pain, dyspnea on exertion, edema or irregular heartbeat Gastrointestinal ROS: negative for - abdominal pain, diarrhea, hematemesis, nausea/vomiting or stool incontinence Genito-Urinary ROS: negative for - dysuria, hematuria, incontinence or urinary frequency/urgency Musculoskeletal ROS: negative for - joint swelling or muscular weakness Neurological ROS: as noted in HPI Dermatological ROS: negative for rash and skin lesion changes  Physical Examination: Blood pressure 187/115, pulse 73, temperature 97.9 F (36.6 C), temperature source Oral, resp. rate 17, weight 77.111 kg (170 lb), SpO2 96 %. Appearance was that of a moderately overweight middle-aged appearing lady who is alert and in no acute distress except for complaint of headache. HEENT: Normal Neck was supple with normal range of motion and no tenderness. Heart rate and rhythm were normal. Heart sounds were normal. Extremities were normal in appearance with no edema and no discoloration.  Neurologic Examination: Mental Status: Alert, oriented, thought content appropriate.  Speech fluent without evidence of aphasia. Able to follow commands without difficulty. Cranial Nerves: II-Visual fields were normal. III/IV/VI-Pupils were equal and reacted. Extraocular movements were full and conjugate.    V/VII-no facial numbness and no facial weakness. VIII-normal. X-normal speech and symmetrical palatal movement. XII-midline tongue extension Motor: 5/5 bilaterally with normal tone and bulk Sensory: Normal throughout. Deep Tendon Reflexes: 2+ and symmetric. Plantars: Flexor bilaterally Cerebellar: Normal finger-to-nose  testing. Carotid auscultation: Normal  Ct Head (brain) Wo Contrast  01/26/2015   CLINICAL DATA:  Code stroke. Right-sided weakness, acute onset. Initial encounter.  EXAM: CT HEAD WITHOUT CONTRAST  TECHNIQUE: Contiguous axial images were obtained from the base of the skull through the vertex without intravenous contrast.  COMPARISON:  CT of the paranasal sinuses performed 10/17/2005  FINDINGS: There is no evidence of acute infarction, mass lesion, or intra- or extra-axial hemorrhage on CT.  Mild periventricular white matter change likely reflects small vessel ischemic microangiopathy.  The posterior fossa, including the cerebellum, brainstem and fourth ventricle, is within normal limits. The third and lateral ventricles, and basal ganglia are unremarkable in appearance. The cerebral hemispheres are symmetric in appearance, with normal gray-white differentiation. No mass effect or midline shift is seen.  There is no evidence of fracture; visualized osseous structures are unremarkable in appearance. The orbits are within normal limits. The paranasal sinuses and mastoid air cells are well-aerated. No significant soft tissue abnormalities are seen.  IMPRESSION: 1. No acute intracranial pathology seen on CT. 2. Mild small vessel ischemic microangiopathy noted.  These results were called by telephone at the time of interpretation on 01/26/2015 at 6:57 pm to Dr. Lorre NickANTHONY ALLEN, who verbally acknowledged these results.   Electronically Signed   By: Roanna RaiderJeffery  Chang M.D.   On: 01/26/2015 18:58    Assessment: 54 y.o. female with a history of hypertension and noncompliance with treatment presenting with recurrent transient right-sided deficits with associated headache. Deficits have resolved at this point. Complicated migraine headache is a strong possibility. However, a small MCA territory TIA or ischemic stroke cannot be ruled out at this point.  Stroke Risk Factors - hypertension  Plan: 1. HgbA1c, fasting lipid  panel 2. MRI, MRA  of the brain without contrast 3. PT consult, OT consult, Speech consult 4. Echocardiogram 5. Carotid dopplers 6. Prophylactic therapy-Antiplatelet med: Aspirin  7. Risk factor modification 8. Telemetry monitoring   C.R. Roseanne RenoStewart, MD [facility] [phone]  01/26/2015, 7:52 PM

## 2015-01-27 ENCOUNTER — Encounter (HOSPITAL_COMMUNITY): Payer: Self-pay | Admitting: Emergency Medicine

## 2015-01-27 ENCOUNTER — Emergency Department (HOSPITAL_COMMUNITY)
Admission: EM | Admit: 2015-01-27 | Discharge: 2015-01-28 | Disposition: A | Discharge status: Home / Self Care | Payer: Medicaid Other | Attending: Emergency Medicine | Admitting: Emergency Medicine

## 2015-01-27 DIAGNOSIS — F329 Major depressive disorder, single episode, unspecified: Secondary | ICD-10-CM | POA: Diagnosis not present

## 2015-01-27 DIAGNOSIS — I10 Essential (primary) hypertension: Secondary | ICD-10-CM | POA: Diagnosis not present

## 2015-01-27 DIAGNOSIS — G43009 Migraine without aura, not intractable, without status migrainosus: Secondary | ICD-10-CM

## 2015-01-27 DIAGNOSIS — Z880 Allergy status to penicillin: Secondary | ICD-10-CM | POA: Insufficient documentation

## 2015-01-27 DIAGNOSIS — Z7982 Long term (current) use of aspirin: Secondary | ICD-10-CM | POA: Insufficient documentation

## 2015-01-27 DIAGNOSIS — E785 Hyperlipidemia, unspecified: Secondary | ICD-10-CM

## 2015-01-27 DIAGNOSIS — Z79899 Other long term (current) drug therapy: Secondary | ICD-10-CM | POA: Diagnosis not present

## 2015-01-27 DIAGNOSIS — Z8719 Personal history of other diseases of the digestive system: Secondary | ICD-10-CM | POA: Insufficient documentation

## 2015-01-27 DIAGNOSIS — Z8639 Personal history of other endocrine, nutritional and metabolic disease: Secondary | ICD-10-CM | POA: Insufficient documentation

## 2015-01-27 DIAGNOSIS — G459 Transient cerebral ischemic attack, unspecified: Secondary | ICD-10-CM

## 2015-01-27 DIAGNOSIS — G43909 Migraine, unspecified, not intractable, without status migrainosus: Secondary | ICD-10-CM

## 2015-01-27 LAB — CBC
HCT: 35.2 % — ABNORMAL LOW (ref 36.0–46.0)
HCT: 36.5 % (ref 36.0–46.0)
HEMOGLOBIN: 12.8 g/dL (ref 12.0–15.0)
HEMOGLOBIN: 13.3 g/dL (ref 12.0–15.0)
MCH: 30 pg (ref 26.0–34.0)
MCH: 30 pg (ref 26.0–34.0)
MCHC: 36.4 g/dL — ABNORMAL HIGH (ref 30.0–36.0)
MCHC: 36.4 g/dL — AB (ref 30.0–36.0)
MCV: 82.2 fL (ref 78.0–100.0)
MCV: 82.4 fL (ref 78.0–100.0)
PLATELETS: 241 10*3/uL (ref 150–400)
PLATELETS: 283 10*3/uL (ref 150–400)
RBC: 4.27 MIL/uL (ref 3.87–5.11)
RBC: 4.44 MIL/uL (ref 3.87–5.11)
RDW: 12.2 % (ref 11.5–15.5)
RDW: 12.1 % (ref 11.5–15.5)
WBC: 9.2 10*3/uL (ref 4.0–10.5)
WBC: 9.6 10*3/uL (ref 4.0–10.5)

## 2015-01-27 LAB — COMPREHENSIVE METABOLIC PANEL
ALT: 18 U/L (ref 0–35)
AST: 21 U/L (ref 0–37)
Albumin: 3.9 g/dL (ref 3.5–5.2)
Alkaline Phosphatase: 79 U/L (ref 39–117)
Anion gap: 4 — ABNORMAL LOW (ref 5–15)
BUN: 15 mg/dL (ref 6–23)
CHLORIDE: 104 mmol/L (ref 96–112)
CO2: 30 mmol/L (ref 19–32)
Calcium: 9.4 mg/dL (ref 8.4–10.5)
Creatinine, Ser: 0.8 mg/dL (ref 0.50–1.10)
GFR calc non Af Amer: 83 mL/min — ABNORMAL LOW (ref >90)
GLUCOSE: 97 mg/dL (ref 70–99)
POTASSIUM: 3.2 mmol/L — AB (ref 3.5–5.1)
Sodium: 138 mmol/L (ref 135–145)
TOTAL PROTEIN: 6.4 g/dL (ref 6.0–8.3)
Total Bilirubin: 0.7 mg/dL (ref 0.3–1.2)

## 2015-01-27 LAB — CREATININE, SERUM
CREATININE: 0.8 mg/dL (ref 0.50–1.10)
GFR, EST NON AFRICAN AMERICAN: 83 mL/min — AB (ref >90)

## 2015-01-27 LAB — LIPID PANEL
Cholesterol: 215 mg/dL — ABNORMAL HIGH (ref 0–200)
HDL: 31 mg/dL — ABNORMAL LOW (ref >39)
LDL Cholesterol: 136 mg/dL — ABNORMAL HIGH (ref 0–99)
Total CHOL/HDL Ratio: 6.9 RATIO
Triglycerides: 241 mg/dL — ABNORMAL HIGH (ref <150)
VLDL: 48 mg/dL — AB (ref 0–40)

## 2015-01-27 LAB — MRSA PCR SCREENING: MRSA BY PCR: NEGATIVE

## 2015-01-27 MED ORDER — FENOFIBRATE 48 MG PO TABS: 48.0000 mg | ORAL_TABLET | Freq: Every day | ORAL | Status: AC

## 2015-01-27 MED ORDER — FENTANYL CITRATE 0.05 MG/ML IJ SOLN
25.0000 ug | Freq: Once | INTRAMUSCULAR | Status: AC
  Administered 2015-01-27: 25 ug via INTRAVENOUS
  Filled 2015-01-27: qty 2

## 2015-01-27 MED ORDER — METOPROLOL TARTRATE 25 MG PO TABS
25.0000 mg | ORAL_TABLET | Freq: Once | ORAL | Status: AC
  Administered 2015-01-28: 25 mg via ORAL
  Filled 2015-01-27: qty 1

## 2015-01-27 MED ORDER — SODIUM CHLORIDE 0.9 % IV SOLN
INTRAVENOUS | Status: AC
  Administered 2015-01-27: 01:00:00 via INTRAVENOUS

## 2015-01-27 MED ORDER — POTASSIUM CHLORIDE CRYS ER 20 MEQ PO TBCR
40.0000 meq | EXTENDED_RELEASE_TABLET | Freq: Once | ORAL | Status: AC
  Administered 2015-01-27: 40 meq via ORAL
  Filled 2015-01-27: qty 2

## 2015-01-27 MED ORDER — INFLUENZA VAC SPLIT QUAD 0.5 ML IM SUSY: 0.5000 mL | PREFILLED_SYRINGE | INTRAMUSCULAR | Status: DC

## 2015-01-27 MED ORDER — ASPIRIN 81 MG PO TBEC: 81.0000 mg | DELAYED_RELEASE_TABLET | Freq: Every day | ORAL | Status: AC

## 2015-01-27 MED ORDER — ASPIRIN EC 81 MG PO TBEC
81.0000 mg | DELAYED_RELEASE_TABLET | Freq: Every day | ORAL | Status: DC
  Administered 2015-01-27: 81 mg via ORAL
  Filled 2015-01-27: qty 1

## 2015-01-27 MED ORDER — METOPROLOL TARTRATE 1 MG/ML IV SOLN
2.5000 mg | Freq: Once | INTRAVENOUS | Status: AC
  Administered 2015-01-28: 2.5 mg via INTRAVENOUS
  Filled 2015-01-27: qty 5

## 2015-01-27 MED ORDER — METOPROLOL TARTRATE 25 MG PO TABS: 12.5000 mg | ORAL_TABLET | Freq: Two times a day (BID) | ORAL | Status: DC

## 2015-01-27 MED ORDER — METOCLOPRAMIDE HCL 5 MG/ML IJ SOLN
10.0000 mg | Freq: Once | INTRAMUSCULAR | Status: AC
  Administered 2015-01-27: 10 mg via INTRAVENOUS

## 2015-01-27 MED ORDER — ALPRAZOLAM 0.25 MG PO TABS: 0.2500 mg | ORAL_TABLET | Freq: Every evening | ORAL | Status: AC | PRN

## 2015-01-27 NOTE — Progress Notes (Signed)
Echocardiogram 2D Echocardiogram has been performed.  Sherri Lynch 01/27/2015, 12:17 PM

## 2015-01-27 NOTE — Progress Notes (Signed)
PT Note Orthostatic BPs  Supine 162/94, 68 bpm  Sitting 168/116, 70 bpm  Standing 181/109, 72 bpm  Supine  170/103, 72 bpm  Upon standing, noted BP systolic was elevated above 180 therefore called nursing as the parameter was set at 180.  Nursing to call MD.  Full note to follow.  Thanks. Sgmc Berrien CampusDawn Mandalyn Pasqua,PT Acute Rehabilitation [phone] (pager)

## 2015-01-27 NOTE — Progress Notes (Signed)
OT Cancellation Note  Patient Details Name: Sherri Lynch MRN: [id number] DOB: [birth year]   Cancelled Treatment:    Reason Eval/Treat Not Completed: Patient not medically ready. Pt currently on active bed rest orders. Acute OT to follow up with pt when medically appropriate.   Nena JordanMiller, Apolinar Bero M   Carney LivingLeeAnn Marie Vonetta Foulk, OTR/L Occupational Therapist [phone] (pager)  01/27/2015, 8:15 AM

## 2015-01-27 NOTE — Evaluation (Signed)
Physical Therapy Evaluation and D/C Patient Details Name: Sherri Lynch MRN: [id number] DOB: December 28, 1960 Today's Date: 01/27/2015   History of Present Illness  Pt is a 54 y.o. Female admitted 01/26/15 with c/o HA and R sided weakness, numbeness. Pt reports 1 previous episode like this about a month ago. PMH: migraine, HTN, NAFLD. Pt was not a TPA candidate in the ER. Head CT and MRI were negative.  Clinical Impression  Pt admitted with above diagnosis. Pt currently without significant functional limitations at this time and is Modif independent with all mobility.   Pt does not need PT at this time as physically she is doing well.  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  Pt has a lot of stress related to this per pt as she has to carefully manage her daughters day so that daughter does not have incr stress or her symptoms will get worse.  Pt says her 569 year old and husband is helping her 53 yo now as husband doesn't follow her routine as well as the 53 year old does.   Will sign of due to no PT needs at this time.  Thanks.      Follow Up Recommendations No PT follow up    Equipment Recommendations  None recommended by PT    Recommendations for Other Services       Precautions / Restrictions Precautions Precautions: Other (comment) (Incr BP with position changes.) Required Braces or Orthoses: Other Brace/Splint Other Brace/Splint: Pt reports she has been wearing R thumb immobilizer for an injury to her hand.  Restrictions Weight Bearing Restrictions: No      Mobility  Bed Mobility Overal bed mobility: Modified Independent                Transfers Overall transfer level: Modified independent                  Ambulation/Gait Ambulation/Gait assistance: Independent Ambulation Distance (Feet): 30 Feet Assistive device: None Gait Pattern/deviations: WFL(Within Functional Limits)   Gait velocity interpretation: at or above normal speed for  age/gender General Gait Details: Pt not tested in long distance ambulation due to incr BP just standing up.  Pt is steady and states physically she is at baseline.    Stairs            Wheelchair Mobility    Modified Rankin (Stroke Patients Only) Modified Rankin (Stroke Patients Only) Pre-Morbid Rankin Score: No symptoms Modified Rankin: No significant disability     Balance Overall balance assessment: Needs assistance;History of Falls         Standing balance support: No upper extremity supported;During functional activity Standing balance-Leahy Scale: Normal Standing balance comment: No LOB with challenges. Single Leg Stance - Right Leg: 45 Single Leg Stance - Left Leg: 45     Rhomberg - Eyes Opened: 30                   Pertinent Vitals/Pain Pain Assessment: 0-10 Pain Score: 3  Pain Location: HA Pain Descriptors / Indicators: Aching Pain Intervention(s): Limited activity within patient's tolerance;Monitored during session;Patient requesting pain meds-RN notified;Repositioned  Orthostatic BPs  Supine 162/94, 68 bpm  Sitting 168/116, 70 bpm  Standing 181/109, 72 bpm  Supine  170/103, 72 bpm  Upon standing, noted BP systolic was elevated above 180 therefore called nursing as the parameter was set at 180     Home Living Family/patient expects to be discharged to:: Private residence  Living Arrangements: Spouse/significant other;Children Available Help at Discharge: Family;Available 24 hours/day Type of Home: House Home Access: Stairs to enter   [employer] of Steps: 4 Home Layout: Two level;Able to live on main level with bedroom/bathroom;1/2 bath on main level Home Equipment: Crutches Additional Comments: Pt reports that she fell on the stairs previously resulting in foot fx and has been afraid of the stairs. She sleeps on the main level, but does go upstairs to shower.     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  Pt has a lot of stress related to this per pt as she has to carefully manage her daughters day so that daughter does not have incr stress or her symptoms will get worse.  Pt says her 93 year old and husband is helping her 23 yo now as husband doesn't follow her routine as well as the 53 year old does.       Hand Dominance   Dominant Hand: Right    Extremity/Trunk Assessment   Upper Extremity Assessment: Defer to OT evaluation RUE Deficits / Details: hx of R hand injury, pt reports tendon repair to R thumb several months ago and states that she woke up Christmas morning with pain. She has been wearing a thumb spica at all times to rest the joint. Pt has a follow up with Dr. Eulah Pont this week. She is able to oppose thumb to 5th digit. All MMT WFL and sensation intact.          Lower Extremity Assessment: Overall WFL for tasks assessed      Cervical / Trunk Assessment: Normal  Communication   Communication: No difficulties  Cognition Arousal/Alertness: Awake/alert Behavior During Therapy: WFL for tasks assessed/performed Overall Cognitive Status: Within Functional Limits for tasks assessed                      General Comments      Exercises        Assessment/Plan    PT Assessment Patent does not need any further PT services  PT Diagnosis     PT Problem List    PT Treatment Interventions     PT Goals (Current goals can be found in the Care Plan section) Acute Rehab PT Goals PT Goal Formulation: All assessment and education complete, DC therapy    Frequency     Barriers to discharge        Co-evaluation               End of Session Equipment Utilized During Treatment: Gait belt Activity Tolerance: Other (comment) (Limited by incr BP) Patient left: in bed;with call bell/phone within reach Nurse Communication: Mobility status;Other (comment) (notified of incr BP)          Time: 4098-1191 PT Time Calculation (min) (ACUTE ONLY): 18 min   Charges:   PT Evaluation $Initial PT Evaluation Tier I: 1 Procedure     PT G CodesBerline Lopes 17-Feb-2015, 10:46 AM  [facility] [phone] (pager)

## 2015-01-27 NOTE — ED Provider Notes (Signed)
CSN: [id number]     Arrival date & time 01/27/15  2244 History   First MD Initiated Contact with Patient 01/27/15 2306     This chart was scribed for Sherri Mackie, MD by Arlan Organ, ED Scribe. This patient was seen in room A12C/A12C and the patient's care was started 11:18 PM.   Chief Complaint  Patient presents with  . Hypertension   HPI  HPI Comments: Sherri Lynch is a 54 y.o. female with a PMHx of HTN, hyperlipidemia, and depression who presents to the Emergency Department here for ongoing hypertension this evening. Pt also reports feeling as though her heart is racing x few weeks. Sherri Lynch was seen yesterday for same and was released today at approximately 4:30 PM. During visit, CAT scan and MRI performed without any abnormalities. Blood pressure earlier today was as high as 200/224. Pt was started on Lopressor at discharge yesterday and states she took her first dose this evening. After taking dosage, blood pressure went down to 170 systolic at approximately 5:30 PM this evening. However, pt states blood pressure elevated again around 10 PM with reading of 230/132. Pt was recently taken off of HCTZ as she wanted to start on a medication that would also treat her history of Migraines. No recent fever or chills. Pt with known allergies to Amoxicillin, Ceclor, Morphine, and Penicillins.  Past Medical History  Diagnosis Date  . Hypertension   . Hyperlipidemia   . Depression   . NASH (nonalcoholic steatohepatitis)    History reviewed. No pertinent past surgical history. No family history on file. History  Substance Use Topics  . Smoking status: Never Smoker   . Smokeless tobacco: Not on file  . Alcohol Use: No   OB History    No data available     Review of Systems  Constitutional: Negative for fever and chills.  All other systems reviewed and are negative.     Allergies  Amoxicillin; Ceclor; Morphine and related; and Penicillins  Home Medications   Prior to  Admission medications   Medication Sig Start Date End Date Taking? Authorizing Provider  ALPRAZolam (XANAX) 0.25 MG tablet Take 1 tablet (0.25 mg total) by mouth at bedtime as needed for anxiety or sleep. 01/27/15   Albertine Grates, MD  aspirin EC 81 MG EC tablet Take 1 tablet (81 mg total) by mouth daily. 01/27/15   Albertine Grates, MD  fenofibrate (TRICOR) 48 MG tablet Take 1 tablet (48 mg total) by mouth daily. 01/27/15   Albertine Grates, MD  metoprolol tartrate (LOPRESSOR) 25 MG tablet Take 0.5 tablets (12.5 mg total) by mouth 2 (two) times daily. 01/27/15   Albertine Grates, MD   Triage Vitals: BP 237/139 mmHg  Pulse 91  Temp(Src) 98.6 F (37 C) (Oral)  Resp 18  Ht  (1.676 m)  Wt 168 lb (76.204 kg)  BMI 27.13 kg/m2  SpO2 98%   Physical Exam  Constitutional: She is oriented to person, place, and time. She appears well-developed and well-nourished. No distress.  HENT:  Head: Normocephalic and atraumatic.  Right Ear: External ear normal.  Left Ear: External ear normal.  Nose: Nose normal.  Mouth/Throat: Oropharynx is clear and moist.  Eyes: Conjunctivae and EOM are normal. Pupils are equal, round, and reactive to light.  Neck: Normal range of motion. Neck supple. No JVD present. No tracheal deviation present. No thyromegaly present.  Cardiovascular: Normal rate, regular rhythm, normal heart sounds and intact distal pulses.  Exam reveals no gallop  and no friction rub.   No murmur heard. Pulmonary/Chest: Effort normal and breath sounds normal. No stridor. No respiratory distress. She has no wheezes. She has no rales. She exhibits no tenderness.  Abdominal: Soft. Bowel sounds are normal. She exhibits no distension and no mass. There is no tenderness. There is no rebound and no guarding.  Musculoskeletal: Normal range of motion. She exhibits no edema or tenderness.  Lymphadenopathy:    She has no cervical adenopathy.  Neurological: She is alert and oriented to person, place, and time. She displays normal reflexes. No  cranial nerve deficit. She exhibits normal muscle tone. Coordination normal.  Skin: Skin is warm and dry. No rash noted. She is not diaphoretic. No erythema. No pallor.  Psychiatric: She has a normal mood and affect. Her behavior is normal. Judgment and thought content normal.  Nursing note and vitals reviewed.   ED Course  Procedures (including critical care time)  DIAGNOSTIC STUDIES: Oxygen Saturation is 98% on RA, Normal by my interpretation.    COORDINATION OF CARE: 11:29 PM-Discussed treatment plan with pt at bedside and pt agreed to plan.     Labs Review Labs Reviewed - No data to display  Imaging Review Dg Chest 2 View  01/26/2015   CLINICAL DATA:  54 year old female with a history of right arm numbness and chest pain this morning. Possible TIA.  EXAM: CHEST - 2 VIEW  COMPARISON:  None.  FINDINGS: Cardiomediastinal silhouette projects within normal limits in size and contour. No confluent airspace disease, pneumothorax, or pleural effusion.  No displaced fracture.  Unremarkable appearance of the upper abdomen.  IMPRESSION: No radiographic evidence of acute cardiopulmonary disease.  Signed,  Yvone Neu. Loreta Ave, DO  [facility]  [facility]   Electronically Signed   By: Gilmer Mor D.O.   On: 01/26/2015 20:52   Ct Head (brain) Wo Contrast  01/26/2015   CLINICAL DATA:  Code stroke. Right-sided weakness, acute onset. Initial encounter.  EXAM: CT HEAD WITHOUT CONTRAST  TECHNIQUE: Contiguous axial images were obtained from the base of the skull through the vertex without intravenous contrast.  COMPARISON:  CT of the paranasal sinuses performed 10/17/2005  FINDINGS: There is no evidence of acute infarction, mass lesion, or intra- or extra-axial hemorrhage on CT.  Mild periventricular white matter change likely reflects small vessel ischemic microangiopathy.  The posterior fossa, including the cerebellum, brainstem and fourth ventricle, is within  normal limits. The third and lateral ventricles, and basal ganglia are unremarkable in appearance. The cerebral hemispheres are symmetric in appearance, with normal gray-white differentiation. No mass effect or midline shift is seen.  There is no evidence of fracture; visualized osseous structures are unremarkable in appearance. The orbits are within normal limits. The paranasal sinuses and mastoid air cells are well-aerated. No significant soft tissue abnormalities are seen.  IMPRESSION: 1. No acute intracranial pathology seen on CT. 2. Mild small vessel ischemic microangiopathy noted.  These results were called by telephone at the time of interpretation on 01/26/2015 at 6:57 pm to Dr. Lorre Nick, who verbally acknowledged these results.   Electronically Signed   By: Roanna Raider M.D.   On: 01/26/2015 18:58   Mr Brain Wo Contrast  01/26/2015   CLINICAL DATA:  Persistent headache developed earlier today, hypertension. Acute onset RIGHT-sided weakness beginning at dinner time with headache. Similar episode 1 month ago, spontaneous resolution. Neuro deficits now resolved. History of migraine, hypertension and transient ischemic attack.  EXAM: MRI HEAD WITHOUT CONTRAST  MRA HEAD WITHOUT CONTRAST  TECHNIQUE: Multiplanar, multiecho pulse sequences of the brain and surrounding structures were obtained without intravenous contrast. Angiographic images of the head were obtained using MRA technique without contrast.  COMPARISON:  CT of the head January 26, 2015 at 1851 hr  FINDINGS: MRI HEAD FINDINGS  No reduced diffusion to suggest acute ischemia are hyperacute demyelination. The ventricles and sulci are normal for patient's age. No midline shift, mass effect or mass lesions. Scattered subcentimeter subcortical at an patchy deep and periventricular white matter T2 hyperintensities. No susceptibility artifact to suggest hemorrhage.  No abnormal extra-axial fluid collections. Ocular globes and orbital contents are  unremarkable. Mild paranasal sinus mucosal thickening with RIGHT maxillary sinus mucosal retention cyst. Mastoid air cells are well aerated. No abnormal sellar expansion. No cerebellar tonsillar ectopia.  MRA HEAD FINDINGS  Anterior circulation: Normal flow related enhancement of the included cervical, petrous, cavernous and supra clinoid internal carotid arteries. Patent anterior communicating artery. 2 mm laterally directed outpouching at RIGHT A1 2 junction, axial 86/154 Normal flow related enhancement of the anterior and middle cerebral arteries, including more distal segments.  No large vessel occlusion, high-grade stenosis, abnormal luminal irregularity.  Posterior circulation: RIGHT vertebral artery is dominant. Basilar artery is patent, with normal flow related enhancement of the main branch vessels. Normal flow related enhancement of the posterior cerebral arteries.  No large vessel occlusion, high-grade stenosis, abnormal luminal irregularity, aneurysm.  IMPRESSION: MRI HEAD: No acute intracranial process, specifically no acute ischemia.  Mild to moderate nonspecific white matter changes, advanced for age. These can be seen with chronic hypertension, migraine.  MRA HEAD: 2 mm laterally directed outpouching in RIGHT A1 2 junction, equivocal for aneurysm in may reflect infundibulum.  Otherwise unremarkable MRA of the head.   Electronically Signed   By: Awilda Metro   On: 01/26/2015 23:03   Mr Maxine Glenn Head/brain Wo Cm  01/26/2015   CLINICAL DATA:  Persistent headache developed earlier today, hypertension. Acute onset RIGHT-sided weakness beginning at dinner time with headache. Similar episode 1 month ago, spontaneous resolution. Neuro deficits now resolved. History of migraine, hypertension and transient ischemic attack.  EXAM: MRI HEAD WITHOUT CONTRAST  MRA HEAD WITHOUT CONTRAST  TECHNIQUE: Multiplanar, multiecho pulse sequences of the brain and surrounding structures were obtained without intravenous  contrast. Angiographic images of the head were obtained using MRA technique without contrast.  COMPARISON:  CT of the head January 26, 2015 at 1851 hr  FINDINGS: MRI HEAD FINDINGS  No reduced diffusion to suggest acute ischemia are hyperacute demyelination. The ventricles and sulci are normal for patient's age. No midline shift, mass effect or mass lesions. Scattered subcentimeter subcortical at an patchy deep and periventricular white matter T2 hyperintensities. No susceptibility artifact to suggest hemorrhage.  No abnormal extra-axial fluid collections. Ocular globes and orbital contents are unremarkable. Mild paranasal sinus mucosal thickening with RIGHT maxillary sinus mucosal retention cyst. Mastoid air cells are well aerated. No abnormal sellar expansion. No cerebellar tonsillar ectopia.  MRA HEAD FINDINGS  Anterior circulation: Normal flow related enhancement of the included cervical, petrous, cavernous and supra clinoid internal carotid arteries. Patent anterior communicating artery. 2 mm laterally directed outpouching at RIGHT A1 2 junction, axial 86/154 Normal flow related enhancement of the anterior and middle cerebral arteries, including more distal segments.  No large vessel occlusion, high-grade stenosis, abnormal luminal irregularity.  Posterior circulation: RIGHT vertebral artery is dominant. Basilar artery is patent, with normal flow related enhancement of the main branch vessels. Normal flow related  enhancement of the posterior cerebral arteries.  No large vessel occlusion, high-grade stenosis, abnormal luminal irregularity, aneurysm.  IMPRESSION: MRI HEAD: No acute intracranial process, specifically no acute ischemia.  Mild to moderate nonspecific white matter changes, advanced for age. These can be seen with chronic hypertension, migraine.  MRA HEAD: 2 mm laterally directed outpouching in RIGHT A1 2 junction, equivocal for aneurysm in may reflect infundibulum.  Otherwise unremarkable MRA of the  head.   Electronically Signed   By: Awilda Metroourtnay  Bloomer   On: 01/26/2015 23:03     EKG Interpretation   Date/Time:  Friday January 27 2015 23:04:05 EST Ventricular Rate:  88 PR Interval:  151 QRS Duration: 102 QT Interval:  417 QTC Calculation: 505 R Axis:   -43 Text Interpretation:  Sinus rhythm Incomplete RBBB and LAFB RSR' in V1 or  V2, right VCD or RVH Left ventricular hypertrophy Borderline prolonged QT  interval Baseline wander in lead(s) V2 V4 No significant change since last  tracing Confirmed by POLLINA  MD, CHRISTOPHER [id number]) on 01/27/2015 11:40:10  PM      MDM   Final diagnoses:  Essential hypertension    54 year old female with hypertension discharged earlier today after hypertensive urgency.  Complex migraine versus TIA.  Patient reports that she is compliant with her 12.5 mg of metoprolol prescribed.  Patient does have elevated blood pressures today and slight headache, but has history of known migraines.  She reports this headache is not bad.  No other end organ damage.  Red flags.  Will give patient additional metoprolol as she is mildly tachycardic upon arrival.  Will recommend patient increase to 25 mg twice a day.  Patient requesting hydrochlorothiazide as she likes the diuretic effect.  Patient has a primary care doctor that she will be followed up with.  I personally performed the services described in this documentation, which was scribed in my presence. The recorded information has been reviewed and is accurate.    Sherri Mackielga M Mignonne Afonso, MD 01/28/15 [phone]

## 2015-01-27 NOTE — Progress Notes (Signed)
UR Completed Naquita Nappier Graves-Bigelow, RN,BSN [phone]  

## 2015-01-27 NOTE — ED Notes (Addendum)
Pt. reports elevated blood pressure at home this evening - 224/118 with headache . Pt. was just discharged this afternoon admitted here diagnosed with TIA/Headache/HTN.

## 2015-01-27 NOTE — Progress Notes (Signed)
STROKE TEAM PROGRESS NOTE   HISTORY OF PRESENT ILLNESS Sherri Lynch is an 54 y.o. female with a history of hypertension with noncompliance with taking medication, hyperlipidemial, Ehlers-Danlos syndrome, nonalcoholic steatohepatitis, and migraine headaches, who brought to the emergency room initially at [facility]. Blood pressure was noted to be markedly elevated at 219/162. Facial droop as well as speech difficulty were noted. NIH stroke score initially was 4. CT scan of her head showed no acute intracranial abnormality. Patient gives a history of similar episode several months ago right side visual changes and facial droop as well as speech difficulty with associated severe headache. She has a moderately severe headache with current symptoms. She was transferred to [facility] and code stroke status. Deficits subsequently resolved without intervention. NIH stroke score here was 0. Patient has required IV Cardene drip for management of hypertensive emergency.   SUBJECTIVE (INTERVAL HISTORY) Her husband is at the bedside.  Overall she feels her condition is completely resolved. Patient stated that yesterday she went to see her PCP, was found to have high blood pressure, treated in the clinic. Outdoors she went to restaurant with her husband found to have visual changes on the right side looking at the menu, 2 minutes after, she felt right arm funny feeling with heaviness on the wrist, 15 minutes later she is felt some right face numbness and right leg felt funny. He went to [facility], started to have some slurry speech. The whole episode resolved in 1 hour. At same time she had 10/10 headache with nausea, photophobia and phonophobia.  About one month ago, she had similar episode lasting about 15 minutes. She was watching the movie. Suddenly she felt visual changes, and the movie seems like 3-D movie. Shortly after she had right face numbness. At same time she started to have  headache, and the back of her head and bilateral frontally, the headache lasting 1-2 days after.  She had history of migraine since in college for the last 30 years, almost daily headache with 2 times per week episodes of worsening headache, lasting about 2 hours to 2 days, with photophobia phonophobia, has to lie down in dark room. Ibuprofen is only medications she takes during the headache. She has tried one medication for headache per plexus, but she developed nausea and vomiting side effect, and she stopped the medication, however she was not able to recall the name of medication.  She had history of hypertension, was on medication of losartan however for the last 5 months she ran out of the medication without refills. She also had hyperlipidemia on fenofibrate. She also mentions she has some liver problems in the past, and also Ehlers-Danlos syndrome.  OBJECTIVE Temp:  [97.5 F (36.4 C)-98 F (36.7 C)] 98 F (36.7 C) (02/05 0500) Pulse Rate:  [64-91] 65 (02/05 0500) Cardiac Rhythm:  [-]  Resp:  [11-19] 13 (02/05 0500) BP: (130-219)/(81-162) 148/83 mmHg (02/05 0500) SpO2:  [94 %-100 %] 94 % (02/05 0500) Weight:  [168 lb (76.204 kg)-170 lb (77.111 kg)] 168 lb (76.204 kg) (02/05 0031)   Recent Labs Lab 01/26/15 1901  GLUCAP 100*    Recent Labs Lab 01/26/15 1857 01/26/15 2345 01/27/15 0426  NA 137  --  138  K 3.5  --  3.2*  CL 100  --  104  CO2 26  --  30  GLUCOSE 93  --  97  BUN 19  --  15  CREATININE 0.87 0.80 0.80  CALCIUM  9.6  --  9.4    Recent Labs Lab 01/26/15 1857 01/27/15 0426  AST 27 21  ALT 23 18  ALKPHOS 91 79  BILITOT 0.9 0.7  PROT 7.7 6.4  ALBUMIN 4.7 3.9    Recent Labs Lab 01/26/15 1857 01/26/15 2345 01/27/15 0426  WBC 9.9 9.6 9.2  NEUTROABS 5.7  --   --   HGB 14.2 13.3 12.8  HCT 40.1 36.5 35.2*  MCV 85.5 82.2 82.4  PLT 295 241 283   No results for input(s): CKTOTAL, CKMB, CKMBINDEX, TROPONINI in the last 168 hours.  Recent Labs   01/26/15 1857  LABPROT 13.0  INR 0.97    Recent Labs  01/26/15 2121  COLORURINE STRAW*  LABSPEC 1.008  PHURINE 7.0  GLUCOSEU NEGATIVE  HGBUR NEGATIVE  BILIRUBINUR NEGATIVE  KETONESUR NEGATIVE  PROTEINUR NEGATIVE  UROBILINOGEN 0.2  NITRITE NEGATIVE  LEUKOCYTESUR NEGATIVE       Component Value Date/Time   CHOL 215* 01/27/2015 0426   TRIG 241* 01/27/2015 0426   HDL 31* 01/27/2015 0426   CHOLHDL 6.9 01/27/2015 0426   VLDL 48* 01/27/2015 0426   LDLCALC 136* 01/27/2015 0426   No results found for: HGBA1C No results found for: LABOPIA, COCAINSCRNUR, LABBENZ, AMPHETMU, THCU, LABBARB  No results for input(s): ETH in the last 168 hours.  I have personally reviewed the radiological images below and agree with the radiology interpretations.  Dg Chest 2 View  01/26/2015    IMPRESSION: No radiographic evidence of acute cardiopulmonary disease.   Ct Head (brain) Wo Contrast  01/26/2015    IMPRESSION: 1. No acute intracranial pathology seen on CT. 2. Mild small vessel ischemic microangiopathy noted.    Mri and Mra Head/brain Wo Cm  01/26/2015    IMPRESSION: MRI HEAD: No acute intracranial process, specifically no acute ischemia.  Mild to moderate nonspecific white matter changes, advanced for age. These can be seen with chronic hypertension, migraine.  MRA HEAD: 2 mm laterally directed outpouching in RIGHT A1 2 junction, equivocal for aneurysm in may reflect infundibulum.  Otherwise unremarkable MRA of the head.    Carotid Doppler  pending  2D Echocardiogram pending  EKG  normal sinus rhythm. For complete results please see formal report.  PHYSICAL EXAM  Temp:  [97.5 F (36.4 C)-98 F (36.7 C)] 98 F (36.7 C) (02/05 0500) Pulse Rate:  [64-91] 65 (02/05 0500) Resp:  [11-19] 13 (02/05 0500) BP: (130-219)/(81-162) 148/83 mmHg (02/05 0500) SpO2:  [94 %-100 %] 94 % (02/05 0500) Weight:  [168 lb (76.204 kg)-170 lb (77.111 kg)] 168 lb (76.204 kg) (02/05 0031)  General - Well  nourished, well developed, in no apparent distress.  Ophthalmologic - Sharp disc margins OU.  Cardiovascular - Regular rate and rhythm with no murmur.  Neck - supple, no carotid bruits  Mental Status -  Level of arousal and orientation to time, place, and person were intact. Language including expression, naming, repetition, comprehension was assessed and found intact. Attention span and concentration were normal. Recent and remote memory were intact. Fund of Knowledge was assessed and was intact.  Cranial Nerves II - XII - II - Visual field intact OU. III, IV, VI - Extraocular movements intact. V - Facial sensation intact bilaterally. VII - Facial movement intact bilaterally. VIII - Hearing & vestibular intact bilaterally. X - Palate elevates symmetrically. XI - Chin turning & shoulder shrug intact bilaterally. XII - Tongue protrusion intact.  Motor Strength - The patient's strength was normal in all extremities and  pronator drift was absent.  Bulk was normal and fasciculations were absent.   Motor Tone - Muscle tone was assessed at the neck and appendages and was normal.  Reflexes - The patient's reflexes were symmetrical in all extremities and she had no pathological reflexes.  Sensory - Light touch, temperature/pinprick were assessed and were symmetrical.    Coordination - The patient had normal movements in the hands and feet with no ataxia or dysmetria.  Tremor was absent.  Gait and Station - The patient's transfers, posture, gait, station, and turns were observed as normal.   ASSESSMENT/PLAN Ms. CRISTINA CENICEROS is a 54 y.o. female with history of hypertension, migraine, Ehlers-Danlos syndrome admitted for transient right-sided numbness with headache. Symptoms resolved in 2 hours.  She also had one similar episode with headache 1 months ago resolved in 15 minutes. She has long-standing history of migraine headache.  Complicated migraine:  Patient history, symptoms and  description are most consistent with common dictating migraine. Due to the frequency of migraine headache, she bowel benefit from migraine prophylactic medications. In the meantime, she has stroke risk factors including hypertension, hyperlipidemia, migraine with aura. She will also benefit from stroke prevention medications.  MRI  negative for stroke  MRA  unremarkable  Carotid Doppler  pending  2D Echo  pending  LDL 136, not at goal  HgbA1c pending  Lovenox subcutaneous for VTE prophylaxis  Diet regular   no antithrombotic prior to admission, now on aspirin 81 mg orally every day. Continue baby aspirin at discharge for stroke prevention.  Please consider migraine prophylactic therapy with beta blocker such as propranolol, metoprolol since patient also has hypertension.  Patient counseled to be compliant with her antithrombotic medications  Ongoing aggressive stroke risk factor management  Therapy recommendations: not require  Disposition:  Home and follow up with PCP  Hypertension  Home meds:   Losartan and HCTZ Blood pressure goal, normotensive Consider to resume home medication  Stable  Patient counseled to be compliant with her blood pressure medications  Hyperlipidemia  Home meds:  None   LDL 136, goal < 70  Consider to add statin if not contraindicated  Other Active Problems  Ehlers-Danlos syndrome  Other Pertinent History  Hospital day # 1  I have personally obtained the history, examined the patient, evaluated laboratory data, individually viewed imaging studies and agree with radiology interpretations. I also discussed with Dr. Albertine Grates regarding her care plan.   Neurology will sign off. Please call with questions. Pt will follow up with her PCP. No neurology follow up needed at this time. Thanks for the consult.  Marvel Plan, MD PhD Stroke Neurology 01/27/2015 1:48 PM    To contact Stroke Continuity provider, please refer to [email or web address]. After  hours, contact General Neurology

## 2015-01-27 NOTE — Evaluation (Signed)
Clinical/Bedside Swallow Evaluation Patient Details  Name: Sherri Lynch MRN: [id number] Date of Birth: 02-26-1961  Today's Date: 01/27/2015 Time: SLP Start Time (ACUTE ONLY): 1124 SLP Stop Time (ACUTE ONLY): 1141 SLP Time Calculation (min) (ACUTE ONLY): 17 min  Past Medical History:  Past Medical History  Diagnosis Date  . Hypertension   . Hyperlipidemia   . Depression   . NASH (nonalcoholic steatohepatitis)    Past Surgical History: History reviewed. No pertinent past surgical history. HPI:  Pt is a 54 y.o. Female admitted 01/26/15 with c/o HA and R sided weakness, numbeness. Pt reports 1 previous episode like this about a month ago. PMH: migraine, HTN, NAFLD. Pt was not a TPA candidate in the ER. Head CT and MRI were negative.    Assessment / Plan / Recommendation Clinical Impression  Pt has throat clearing following straw sips of thin liquids, as well as delayed throat clearing after PO intake had ceased. Pt is without subjective c/o dysphagia and overall oropharyngeal swallow appears timely and coordinated. Given that MRI was negative and pt reports resolution of all symptoms, suspect that this is close to or near pt's baseline - question possible esophageal source for throat clearing? Recommend regular diet and thin liquids, avoiding straws. Pt and husband also politely decline cognitive-linguistic evaluation at this time. SLP to sign off as no acute needs are identified.    Aspiration Risk  Mild    Diet Recommendation Regular;Thin liquid   Liquid Administration via: Cup;No straw Medication Administration: Whole meds with liquid Supervision: Patient able to self feed;Intermittent supervision to cue for compensatory strategies Compensations: Slow rate;Small sips/bites Postural Changes and/or Swallow Maneuvers: Seated upright 90 degrees;Upright 30-60 min after meal    Other  Recommendations Oral Care Recommendations: Oral care BID   Follow Up Recommendations  None     Frequency and Duration        Pertinent Vitals/Pain n/a    SLP Swallow Goals     Swallow Study Prior Functional Status  Type of Home: House Available Help at Discharge: Family;Available 24 hours/day    General Date of Onset: 01/26/15 HPI: Pt is a 54 y.o. Female admitted 01/26/15 with c/o HA and R sided weakness, numbeness. Pt reports 1 previous episode like this about a month ago. PMH: migraine, HTN, NAFLD. Pt was not a TPA candidate in the ER. Head CT and MRI were negative.  Type of Study: Bedside swallow evaluation Previous Swallow Assessment: none in chart Diet Prior to this Study: NPO Temperature Spikes Noted: No Respiratory Status: Room air History of Recent Intubation: No Behavior/Cognition: Alert;Cooperative;Pleasant mood Oral Cavity - Dentition: Adequate natural dentition Self-Feeding Abilities: Able to feed self Patient Positioning: Upright in bed Baseline Vocal Quality: Clear Volitional Cough: Strong Volitional Swallow: Able to elicit    Oral/Motor/Sensory Function Overall Oral Motor/Sensory Function: Appears within functional limits for tasks assessed   Ice Chips Ice chips: Not tested   Thin Liquid Thin Liquid: Impaired Presentation: Cup;Self Fed;Straw Pharyngeal  Phase Impairments: Throat Clearing - Immediate (throat clearing with straw)    Nectar Thick Nectar Thick Liquid: Not tested   Honey Thick Honey Thick Liquid: Not tested   Puree Puree: Within functional limits Presentation: Self Fed;Spoon   Solid   GO    Solid: Within functional limits Presentation: Self Fed       Sherri Lynch, M.A. CCC-SLP [phone]  Sherri Lynch, Sherri Lynch 01/27/2015,12:09 PM

## 2015-01-27 NOTE — Discharge Instructions (Signed)
STROKE/TIA DISCHARGE INSTRUCTIONS SMOKING Cigarette smoking nearly doubles your risk of having a stroke & is the single most alterable risk factor  If you smoke or have smoked in the last 12 months, you are advised to quit smoking for your health.  Most of the excess cardiovascular risk related to smoking disappears within a year of stopping.  Ask you doctor about anti-smoking medications  [place] Quit Line: 1-800-QUIT NOW  Free Smoking Cessation Classes [phone]  CHOLESTEROL Know your levels; limit fat & cholesterol in your diet  Lipid Panel     Component Value Date/Time   CHOL 215* 01/27/2015 0426   TRIG 241* 01/27/2015 0426   HDL 31* 01/27/2015 0426   CHOLHDL 6.9 01/27/2015 0426   VLDL 48* 01/27/2015 0426   LDLCALC 136* 01/27/2015 0426      Many patients benefit from treatment even if their cholesterol is at goal.  Goal: Total Cholesterol (CHOL) less than 160  Goal:  Triglycerides (TRIG) less than 150  Goal:  HDL greater than 40  Goal:  LDL (LDLCALC) less than 100   BLOOD PRESSURE American Stroke Association blood pressure target is less that 120/80 mm/Hg  Your discharge blood pressure is:  BP: (!) 148/83 mmHg  Monitor your blood pressure  Limit your salt and alcohol intake  Many individuals will require more than one medication for high blood pressure  DIABETES (A1c is a blood sugar average for last 3 months) Goal HGBA1c is under 7% (HBGA1c is blood sugar average for last 3 months)  Diabetes: No known diagnosis of diabetes    No results found for: HGBA1C   Your HGBA1c can be lowered with medications, healthy diet, and exercise.  Check your blood sugar as directed by your physician  Call your physician if you experience unexplained or low blood sugars.  PHYSICAL ACTIVITY/REHABILITATION Goal is 30 minutes at least 4 days per week  Activity: No restrictions. Therapies: Physical Therapy: none recommended Return to work:   Activity decreases your risk of heart  attack and stroke and makes your heart stronger.  It helps control your weight and blood pressure; helps you relax and can improve your mood.  Participate in a regular exercise program.  Talk with your doctor about the best form of exercise for you (dancing, walking, swimming, cycling).  DIET/WEIGHT Goal is to maintain a healthy weight  Your discharge diet is: Diet regular Diet - low sodium heart healthy thin liquids Your height is:  Height: 5\' 6"  (167.6 cm) Your current weight is: Weight: 76.204 kg (168 lb) Your Body Mass Index (BMI) is:  BMI (Calculated): 27.2  Following the type of diet specifically designed for you will help prevent another stroke.  Your goal weight range is:  118 - 148  Your goal Body Mass Index (BMI) is 19-24.  Healthy food habits can help reduce 3 risk factors for stroke:  High cholesterol, hypertension, and excess weight.  RESOURCES Stroke/Support Group:  Call [phone]   STROKE EDUCATION PROVIDED/REVIEWED AND GIVEN TO PATIENT Stroke warning signs and symptoms How to activate emergency medical system (call 911). Medications prescribed at discharge. Need for follow-up after discharge. Personal risk factors for stroke. Pneumonia vaccine given: No Flu vaccine given: No My questions have been answered, the writing is legible, and I understand these instructions.  I will adhere to these goals & educational materials that have been provided to me after my discharge from the hospital.

## 2015-01-27 NOTE — Discharge Summary (Signed)
Discharge Summary  Sherri Lynch:[id number] DOB: Sep 03, 1961  PCP: Willow Ora, MD  Admit date: 01/26/2015 Discharge date: 01/27/2015  Time spent: less than  Recommendations for Outpatient Follow-up:  1. F/u with pmd for further adjustment of bp meds. Monitor liver function, lipid profile.  Discharge Diagnoses:  Active Hospital Problems   Diagnosis Date Noted  . TIA (transient ischemic attack) 01/26/2015  . Hypertensive emergency 01/26/2015  . Migraine headache 01/26/2015    Resolved Hospital Problems   Diagnosis Date Noted Date Resolved  No resolved problems to display.    Discharge Condition: stable, improved  Diet recommendation: heart health diet  Filed Weights   01/26/15 1910 01/27/15 0031  Weight: 77.111 kg (170 lb) 76.204 kg (168 lb)    History of present illness:  History of Present Illness:This is a 54 y.o. year old female with significant past medical history of migraine, HTN, NAFLD presenting with TIA, hypertensive emergency, migraine. Pt states that she developed persistent headache earlier today. Pt states that she saw her PCP. BP was in 220s per pt. Pt states that she was given clonidine in office as well as Rx for ARB. Pt states that she had not filled Rx. Later in the day, during dinner, she developed R sided weakness, HA and R sided hemiparesis. Pt states that she had a similar episode 1 month ago w/ sxs lasting approx 30 mins. Does report high dose NSAID use over the past month for MSK pain-R wrist.Pt went to [facility]. Was evaluated as a code stroke and subsequently directed to [facility]. Neuro consulted on arrival. Felt not to be a tPa candidate. Deficits resolved by time of MD eval. Also BPs in 180s/110s.  Pt given migraine cocktail as well as started on cardene gtt. Head CT no acute abnormality. CBC, CMET WNL. Trop neg x 1. EKG NSR.  Patient is admitted to hospitalist service with neurology consult.  Hospital Course:  Active Problems:   TIA  (transient ischemic attack)   Hypertensive emergency   Migraine headache  1- right sided weakness, facial droop and slurred speech, likely due to migraine -transient, symptom resolved on 2/4. --s/p migraine cocktail  -MRI, MRA no acute findings. 2D ECHO, carotid dopplers final result pending -neurology recommended betablocker for migraine prophylaxis. Discussed with patient, in agreement.  2-Hypertensive Emergency sbp 220 on presentation Initially oncardene gtt -bp stable at time of discharge, patient reported home sbp normally around 130's. Instructed her to continue home bp monitoring, start lopressor, dose titration per pmd direction. Hold losartan/hctz for now  3. Mild hypokalemia, s/p replacement, patient does reported intermittent cramping for the last year. Has been on hctz. On hold for now.  Monitor bmp by pmd  4. Hyperlipidemia: restarted fenofibrate. lft wnl. pmd to monitor lft. H/o statin intolerance due to significant muscle weakness  5. Ehlers-Danlos syndrome, followed by ortho outpatient.  Procedures:  none  Consultations:  neurology  Discharge Exam: BP 148/83 mmHg  Pulse 65  Temp(Src) 98.1 F (36.7 C) (Oral)  Resp 13  Ht  (1.676 m)  Wt 76.204 kg (168 lb)  BMI 27.13 kg/m2  SpO2 94%  General: alert and cooperative HEENT: PERRLA and extra ocular movement intact Heart: S1, S2 normal, no murmur, rub or gallop, regular rate and rhythm Lungs: clear to auscultation, no wheezes or rales and unlabored breathing Abdomen: abdomen is soft without significant tenderness, masses, organomegaly or guarding Extremities: extremities normal, atraumatic, no cyanosis or edema Skin:no rashes Neurology: normal without focal findings   Discharge  Instructions You were cared for by a hospitalist during your hospital stay. If you have any questions about your discharge medications or the care you received while you were in the hospital after you are discharged, you can  call the unit and asked to speak with the hospitalist on call if the hospitalist that took care of you is not available. Once you are discharged, your primary care physician will handle any further medical issues. Please note that NO REFILLS for any discharge medications will be authorized once you are discharged, as it is imperative that you return to your primary care physician (or establish a relationship with a primary care physician if you do not have one) for your aftercare needs so that they can reassess your need for medications and monitor your lab values.  Discharge Instructions    Diet - low sodium heart healthy    Complete by:  As directed      Increase activity slowly    Complete by:  As directed             Medication List    STOP taking these medications        losartan-hydrochlorothiazide 50-12.5 MG per tablet  Commonly known as:  HYZAAR      TAKE these medications        ALPRAZolam 0.25 MG tablet  Commonly known as:  XANAX  Take 1 tablet (0.25 mg total) by mouth at bedtime as needed for anxiety or sleep.     aspirin 81 MG EC tablet  Take 1 tablet (81 mg total) by mouth daily.     fenofibrate 48 MG tablet  Commonly known as:  TRICOR  Take 1 tablet (48 mg total) by mouth daily.     metoprolol tartrate 25 MG tablet  Commonly known as:  LOPRESSOR  Take 0.5 tablets (12.5 mg total) by mouth 2 (two) times daily.       Allergies  Allergen Reactions  . Amoxicillin Other (See Comments)  . Ceclor [Cefaclor]   . Morphine And Related Nausea And Vomiting  . Penicillins        Follow-up Information    Follow up with ANDY,CAMILLE L, MD In 1 week.   Specialty:  Family Medicine   Contact information:   [address] [place] [address] [phone]        The results of significant diagnostics from this hospitalization (including imaging, microbiology, ancillary and laboratory) are listed below for reference.    Significant Diagnostic  Studies: Dg Chest 2 View  01/26/2015   CLINICAL DATA:  54 year old female with a history of right arm numbness and chest pain this morning. Possible TIA.  EXAM: CHEST - 2 VIEW  COMPARISON:  None.  FINDINGS: Cardiomediastinal silhouette projects within normal limits in size and contour. No confluent airspace disease, pneumothorax, or pleural effusion.  No displaced fracture.  Unremarkable appearance of the upper abdomen.  IMPRESSION: No radiographic evidence of acute cardiopulmonary disease.  Signed,  Yvone Neu. Loreta Ave, DO  [facility]  [facility]   Electronically Signed   By: Gilmer Mor D.O.   On: 01/26/2015 20:52   Ct Head (brain) Wo Contrast  01/26/2015   CLINICAL DATA:  Code stroke. Right-sided weakness, acute onset. Initial encounter.  EXAM: CT HEAD WITHOUT CONTRAST  TECHNIQUE: Contiguous axial images were obtained from the base of the skull through the vertex without intravenous contrast.  COMPARISON:  CT of the paranasal sinuses performed 10/17/2005  FINDINGS: There is no evidence of acute infarction, mass lesion, or intra- or extra-axial hemorrhage on CT.  Mild periventricular white matter change likely reflects small vessel ischemic microangiopathy.  The posterior fossa, including the cerebellum, brainstem and fourth ventricle, is within normal limits. The third and lateral ventricles, and basal ganglia are unremarkable in appearance. The cerebral hemispheres are symmetric in appearance, with normal gray-white differentiation. No mass effect or midline shift is seen.  There is no evidence of fracture; visualized osseous structures are unremarkable in appearance. The orbits are within normal limits. The paranasal sinuses and mastoid air cells are well-aerated. No significant soft tissue abnormalities are seen.  IMPRESSION: 1. No acute intracranial pathology seen on CT. 2. Mild small vessel ischemic microangiopathy noted.  These results were called by  telephone at the time of interpretation on 01/26/2015 at 6:57 pm to Dr. Lorre NickANTHONY ALLEN, who verbally acknowledged these results.   Electronically Signed   By: Roanna RaiderJeffery  Chang M.D.   On: 01/26/2015 18:58   Mr Brain Wo Contrast  01/26/2015   CLINICAL DATA:  Persistent headache developed earlier today, hypertension. Acute onset RIGHT-sided weakness beginning at dinner time with headache. Similar episode 1 month ago, spontaneous resolution. Neuro deficits now resolved. History of migraine, hypertension and transient ischemic attack.  EXAM: MRI HEAD WITHOUT CONTRAST  MRA HEAD WITHOUT CONTRAST  TECHNIQUE: Multiplanar, multiecho pulse sequences of the brain and surrounding structures were obtained without intravenous contrast. Angiographic images of the head were obtained using MRA technique without contrast.  COMPARISON:  CT of the head January 26, 2015 at 1851 hr  FINDINGS: MRI HEAD FINDINGS  No reduced diffusion to suggest acute ischemia are hyperacute demyelination. The ventricles and sulci are normal for patient's age. No midline shift, mass effect or mass lesions. Scattered subcentimeter subcortical at an patchy deep and periventricular white matter T2 hyperintensities. No susceptibility artifact to suggest hemorrhage.  No abnormal extra-axial fluid collections. Ocular globes and orbital contents are unremarkable. Mild paranasal sinus mucosal thickening with RIGHT maxillary sinus mucosal retention cyst. Mastoid air cells are well aerated. No abnormal sellar expansion. No cerebellar tonsillar ectopia.  MRA HEAD FINDINGS  Anterior circulation: Normal flow related enhancement of the included cervical, petrous, cavernous and supra clinoid internal carotid arteries. Patent anterior communicating artery. 2 mm laterally directed outpouching at RIGHT A1 2 junction, axial 86/154 Normal flow related enhancement of the anterior and middle cerebral arteries, including more distal segments.  No large vessel occlusion, high-grade  stenosis, abnormal luminal irregularity.  Posterior circulation: RIGHT vertebral artery is dominant. Basilar artery is patent, with normal flow related enhancement of the main branch vessels. Normal flow related enhancement of the posterior cerebral arteries.  No large vessel occlusion, high-grade stenosis, abnormal luminal irregularity, aneurysm.  IMPRESSION: MRI HEAD: No acute intracranial process, specifically no acute ischemia.  Mild to moderate nonspecific white matter changes, advanced for age. These can be seen with chronic hypertension, migraine.  MRA HEAD: 2 mm laterally directed outpouching in RIGHT A1 2 junction, equivocal for aneurysm in may reflect infundibulum.  Otherwise unremarkable MRA of the head.   Electronically Signed   By: Awilda Metroourtnay  Bloomer   On: 01/26/2015 23:03   Mr Maxine GlennMra Head/brain Wo Cm  01/26/2015   CLINICAL DATA:  Persistent headache developed earlier today, hypertension. Acute onset RIGHT-sided weakness beginning at dinner time with headache. Similar episode 1 month ago, spontaneous resolution. Neuro deficits now resolved. History of migraine, hypertension and transient ischemic attack.  EXAM: MRI HEAD WITHOUT CONTRAST  MRA  HEAD WITHOUT CONTRAST  TECHNIQUE: Multiplanar, multiecho pulse sequences of the brain and surrounding structures were obtained without intravenous contrast. Angiographic images of the head were obtained using MRA technique without contrast.  COMPARISON:  CT of the head January 26, 2015 at 1851 hr  FINDINGS: MRI HEAD FINDINGS  No reduced diffusion to suggest acute ischemia are hyperacute demyelination. The ventricles and sulci are normal for patient's age. No midline shift, mass effect or mass lesions. Scattered subcentimeter subcortical at an patchy deep and periventricular white matter T2 hyperintensities. No susceptibility artifact to suggest hemorrhage.  No abnormal extra-axial fluid collections. Ocular globes and orbital contents are unremarkable. Mild paranasal  sinus mucosal thickening with RIGHT maxillary sinus mucosal retention cyst. Mastoid air cells are well aerated. No abnormal sellar expansion. No cerebellar tonsillar ectopia.  MRA HEAD FINDINGS  Anterior circulation: Normal flow related enhancement of the included cervical, petrous, cavernous and supra clinoid internal carotid arteries. Patent anterior communicating artery. 2 mm laterally directed outpouching at RIGHT A1 2 junction, axial 86/154 Normal flow related enhancement of the anterior and middle cerebral arteries, including more distal segments.  No large vessel occlusion, high-grade stenosis, abnormal luminal irregularity.  Posterior circulation: RIGHT vertebral artery is dominant. Basilar artery is patent, with normal flow related enhancement of the main branch vessels. Normal flow related enhancement of the posterior cerebral arteries.  No large vessel occlusion, high-grade stenosis, abnormal luminal irregularity, aneurysm.  IMPRESSION: MRI HEAD: No acute intracranial process, specifically no acute ischemia.  Mild to moderate nonspecific white matter changes, advanced for age. These can be seen with chronic hypertension, migraine.  MRA HEAD: 2 mm laterally directed outpouching in RIGHT A1 2 junction, equivocal for aneurysm in may reflect infundibulum.  Otherwise unremarkable MRA of the head.   Electronically Signed   By: Awilda Metro   On: 01/26/2015 23:03    Microbiology: Recent Results (from the past 240 hour(s))  MRSA PCR Screening     Status: None   Collection Time: 01/27/15  1:42 AM  Result Value Ref Range Status   MRSA by PCR NEGATIVE NEGATIVE Final    Comment:        The GeneXpert MRSA Assay (FDA approved for NASAL specimens only), is one component of a comprehensive MRSA colonization surveillance program. It is not intended to diagnose MRSA infection nor to guide or monitor treatment for MRSA infections.      Labs: Basic Metabolic Panel:  Recent Labs Lab  01/26/15 1857 01/26/15 2345 01/27/15 0426  NA 137  --  138  K 3.5  --  3.2*  CL 100  --  104  CO2 26  --  30  GLUCOSE 93  --  97  BUN 19  --  15  CREATININE 0.87 0.80 0.80  CALCIUM 9.6  --  9.4   Liver Function Tests:  Recent Labs Lab 01/26/15 1857 01/27/15 0426  AST 27 21  ALT 23 18  ALKPHOS 91 79  BILITOT 0.9 0.7  PROT 7.7 6.4  ALBUMIN 4.7 3.9   No results for input(s): LIPASE, AMYLASE in the last 168 hours. No results for input(s): AMMONIA in the last 168 hours. CBC:  Recent Labs Lab 01/26/15 1857 01/26/15 2345 01/27/15 0426  WBC 9.9 9.6 9.2  NEUTROABS 5.7  --   --   HGB 14.2 13.3 12.8  HCT 40.1 36.5 35.2*  MCV 85.5 82.2 82.4  PLT 295 241 283   Cardiac Enzymes: No results for input(s): CKTOTAL, CKMB, CKMBINDEX, TROPONINI in the  last 168 hours. BNP: BNP (last 3 results) No results for input(s): BNP in the last 8760 hours.  ProBNP (last 3 results) No results for input(s): PROBNP in the last 8760 hours.  CBG:  Recent Labs Lab 01/26/15 1901  GLUCAP 100*       Signed:  Erynne Kealey  [facility] 01/27/2015, 3:07 PM

## 2015-01-27 NOTE — Evaluation (Addendum)
Occupational Therapy Evaluation Patient Details Name: RAYAN DYAL MRN: [id number] DOB: 10-May-1961 Today's Date: 01/27/2015    History of Present Illness Pt is a 54 y.o. Female admitted 01/26/15 with c/o HA and R sided weakness, numbeness. Pt reports 1 previous episode like this about a month ago. PMH: migraine, HTN, NAFLD. Pt was not a TPA candidate in the ER. Head CT and MRI were negative.   Clinical Impression   PTA pt lived at home with her family and was independent with ADLs. Pt is currently at Supervision level for ADLs and functional mobility and reports that her R sided weakness has resolved. Pt continues to have a HA and BP raised to 155/103 sitting EOB with no c/o dizziness. No further acute OT needs.     Follow Up Recommendations  No OT follow up;Supervision/Assistance - 24 hour    Equipment Recommendations  None recommended by OT    Recommendations for Other Services       Precautions / Restrictions Precautions Required Braces or Orthoses: Other Brace/Splint Other Brace/Splint: Pt reports she has been wearing R thumb immobilizer for an injury to her hand.  Restrictions Weight Bearing Restrictions: No      Mobility Bed Mobility Overal bed mobility: Modified Independent                Transfers Overall transfer level: Modified independent                    Balance Overall balance assessment: No apparent balance deficits (not formally assessed)                                          ADL Overall ADL's : Needs assistance/impaired                                       General ADL Comments: Pt overall required Supervision for ADLs. Pt sat EOB and had BP of 155/103 (pt has been around 130's/80's this AM). No c/o dizziness or lightheadedness. Pt ambulated to bathroom with supervision and returned to bed.      Vision  Pt reports no change form baseline.                Additional Comments: Pt reports that  she initially had changes in vision possibly related to HA, however she is at baseline.           Pertinent Vitals/Pain Pain Assessment: 0-10 Pain Score: 5  Pain Location: HA Pain Descriptors / Indicators: Aching;Throbbing Pain Intervention(s): Limited activity within patient's tolerance;Monitored during session;Repositioned     Hand Dominance Right   Extremity/Trunk Assessment Upper Extremity Assessment Upper Extremity Assessment: Overall WFL for tasks assessed;RUE deficits/detail RUE Deficits / Details: hx of R hand injury, pt reports tendon repair to R thumb several months ago and states that she woke up Christmas morning with pain. She has been wearing a thumb spica at all times to rest the joint. Pt has a follow up with Dr. Eulah Pont this week. She is able to oppose thumb to 5th digit. All MMT WFL and sensation intact.    Lower Extremity Assessment Lower Extremity Assessment: Defer to PT evaluation   Cervical / Trunk Assessment Cervical / Trunk Assessment: Normal   Communication Communication Communication: No difficulties   Cognition Arousal/Alertness:  Awake/alert Behavior During Therapy: WFL for tasks assessed/performed Overall Cognitive Status: Within Functional Limits for tasks assessed                                Home Living Family/patient expects to be discharged to:: Private residence Living Arrangements: Spouse/significant other;Children Available Help at Discharge: Family;Available 24 hours/day Type of Home: House Home Access: Stairs to enter Enter[employer] CorporationEntrance Stairs-Number of Steps: 4   Home Layout: Two level;Able to live on main level with bedroom/bathroom;1/2 bath on main level     Bathroom Shower/Tub: Walk-in shower;Tub/shower unit   Bathroom Toilet: Standard     Home Equipment: Crutches   Additional Comments: Pt reports that she fell on the stairs previously resulting in foot fx and has been afraid of the stairs. She sleeps on the main level,  but does go upstairs to shower.       Prior Functioning/Environment Level of Independence: Independent             OT Diagnosis: Generalized weakness    End of Session Equipment Utilized During Treatment: Gait belt  Activity Tolerance: Patient tolerated treatment well Patient left: in bed;with call bell/phone within reach   Time: 0943-1001 OT Time Calculation (min): 18 min Charges:  OT General Charges $OT Visit: 1 Procedure OT Evaluation $Initial OT Evaluation Tier I: 1 Procedure G-Codes:     01/27/15 1000  OT G-codes **NOT FOR INPATIENT CLASS**  Functional Assessment Tool Used clinical judgment  Functional Limitation Self care  Self Care Current Status (W0981(G8987) CI  Self Care Goal Status (X9147(G8988) CI  Self Care Discharge Status (W2956(G8989) CI    Nena JordanMiller, Marcas Bowsher M 01/27/2015, 10:16 AM   Carney LivingLeeAnn Marie Kazmir Oki, OTR/L Occupational Therapist [phone] (pager)  02/03/15 Addendum: added G codes

## 2015-01-28 LAB — HEMOGLOBIN A1C
Hgb A1c MFr Bld: 5 % (ref 4.8–5.6)
Mean Plasma Glucose: 97 mg/dL

## 2015-01-28 MED ORDER — HYDROCHLOROTHIAZIDE 25 MG PO TABS: 25.0000 mg | ORAL_TABLET | Freq: Every day | ORAL | Status: AC

## 2015-01-28 MED ORDER — METOPROLOL TARTRATE 1 MG/ML IV SOLN
5.0000 mg | Freq: Once | INTRAVENOUS | Status: AC
  Administered 2015-01-28: 5 mg via INTRAVENOUS
  Filled 2015-01-28: qty 5

## 2015-01-28 MED ORDER — HYDROCHLOROTHIAZIDE 12.5 MG PO CAPS: 12.5000 mg | ORAL_CAPSULE | Freq: Every day | ORAL | Status: DC

## 2015-01-28 MED ORDER — METOPROLOL TARTRATE 25 MG PO TABS: 25.0000 mg | ORAL_TABLET | Freq: Two times a day (BID) | ORAL | Status: AC

## 2015-01-28 MED ORDER — HYDROCHLOROTHIAZIDE 12.5 MG PO CAPS
12.5000 mg | ORAL_CAPSULE | Freq: Every day | ORAL | Status: DC
  Administered 2015-01-28: 12.5 mg via ORAL
  Filled 2015-01-28: qty 1

## 2015-01-28 NOTE — Discharge Instructions (Signed)
Take medications as prescribed.  Call your doctor tomorrow to set up a follow-up appointment.  Keep a log of your blood pressures twice a day.  Try not to take your blood pressure too often.  Return to the emergency department for worsening condition or new concerning symptoms.    DASH Eating Plan DASH stands for "Dietary Approaches to Stop Hypertension." The DASH eating plan is a healthy eating plan that has been shown to reduce high blood pressure (hypertension). Additional health benefits may include reducing the risk of type 2 diabetes mellitus, heart disease, and stroke. The DASH eating plan may also help with weight loss. WHAT DO I NEED TO KNOW ABOUT THE DASH EATING PLAN? For the DASH eating plan, you will follow these general guidelines:  Choose foods with a percent daily value for sodium of less than 5% (as listed on the food label).  Use salt-free seasonings or herbs instead of table salt or sea salt.  Check with your health care provider or pharmacist before using salt substitutes.  Eat lower-sodium products, often labeled as "lower sodium" or "no salt added."  Eat fresh foods.  Eat more vegetables, fruits, and low-fat dairy products.  Choose whole grains. Look for the word "whole" as the first word in the ingredient list.  Choose fish and skinless chicken or [place] more often than red meat. Limit fish, poultry, and meat to 6 oz (170 g) each day.  Limit sweets, desserts, sugars, and sugary drinks.  Choose heart-healthy fats.  Limit cheese to 1 oz (28 g) per day.  Eat more home-cooked food and less restaurant, buffet, and fast food.  Limit fried foods.  Cook foods using methods other than frying.  Limit canned vegetables. If you do use them, rinse them well to decrease the sodium.  When eating at a restaurant, ask that your food be prepared with less salt, or no salt if possible. WHAT FOODS CAN I EAT? Seek help from a dietitian for individual calorie  needs. Grains Whole grain or whole wheat bread. Brown rice. Whole grain or whole wheat pasta. Quinoa, bulgur, and whole grain cereals. Low-sodium cereals. Corn or whole wheat flour tortillas. Whole grain cornbread. Whole grain crackers. Low-sodium crackers. Vegetables Fresh or frozen vegetables (raw, steamed, roasted, or grilled). Low-sodium or reduced-sodium tomato and vegetable juices. Low-sodium or reduced-sodium tomato sauce and paste. Low-sodium or reduced-sodium canned vegetables.  Fruits All fresh, canned (in natural juice), or frozen fruits. Meat and Other Protein Products Ground beef (85% or leaner), grass-fed beef, or beef trimmed of fat. Skinless chicken or [place]. Ground chicken or [place]. Pork trimmed of fat. All fish and seafood. Eggs. Dried beans, peas, or lentils. Unsalted nuts and seeds. Unsalted canned beans. Dairy Low-fat dairy products, such as skim or 1% milk, 2% or reduced-fat cheeses, low-fat ricotta or cottage cheese, or plain low-fat yogurt. Low-sodium or reduced-sodium cheeses. Fats and Oils Tub margarines without trans fats. Light or reduced-fat mayonnaise and salad dressings (reduced sodium). Avocado. Safflower, olive, or canola oils. Natural peanut or almond butter. Other Unsalted popcorn and pretzels. The items listed above may not be a complete list of recommended foods or beverages. Contact your dietitian for more options. WHAT FOODS ARE NOT RECOMMENDED? Grains White bread. White pasta. White rice. Refined cornbread. Bagels and croissants. Crackers that contain trans fat. Vegetables Creamed or fried vegetables. Vegetables in a cheese sauce. Regular canned vegetables. Regular canned tomato sauce and paste. Regular tomato and vegetable juices. Fruits Dried fruits. Canned fruit in light or  heavy syrup. Fruit juice. Meat and Other Protein Products Fatty cuts of meat. Ribs, chicken wings, bacon, sausage, bologna, salami, chitterlings, fatback, hot dogs, bratwurst,  and packaged luncheon meats. Salted nuts and seeds. Canned beans with salt. Dairy Whole or 2% milk, cream, half-and-half, and cream cheese. Whole-fat or sweetened yogurt. Full-fat cheeses or blue cheese. Nondairy creamers and whipped toppings. Processed cheese, cheese spreads, or cheese curds. Condiments Onion and garlic salt, seasoned salt, table salt, and sea salt. Canned and packaged gravies. Worcestershire sauce. Tartar sauce. Barbecue sauce. Teriyaki sauce. Soy sauce, including reduced sodium. Steak sauce. Fish sauce. Oyster sauce. Cocktail sauce. Horseradish. Ketchup and mustard. Meat flavorings and tenderizers. Bouillon cubes. Hot sauce. Tabasco sauce. Marinades. Taco seasonings. Relishes. Fats and Oils Butter, stick margarine, lard, shortening, ghee, and bacon fat. Coconut, palm kernel, or palm oils. Regular salad dressings. Other Pickles and olives. Salted popcorn and pretzels. The items listed above may not be a complete list of foods and beverages to avoid. Contact your dietitian for more information. WHERE CAN I FIND MORE INFORMATION? National Heart, Lung, and Blood Institute: [email or web address] Document Released: 11/28/2011 Document Revised: 04/25/2014 Document Reviewed: 10/13/2013 [facility] Patient Information 2015 [place], [place]. This information is not intended to replace advice given to you by your health care provider. Make sure you discuss any questions you have with your health care provider.  How to Take Your Blood Pressure HOW DO I GET A BLOOD PRESSURE MACHINE?  You can buy an electronic home blood pressure machine at your local pharmacy. Insurance will sometimes cover the cost if you have a prescription.  Ask your doctor what type of machine is best for you. There are different machines for your arm and your wrist.  If you decide to buy a machine to check your blood pressure on your arm, first check the size of your arm so you can buy the  right size cuff. To check the size of your arm:   Use a measuring tape that shows both inches and centimeters.   Wrap the measuring tape around the upper-middle part of your arm. You may need someone to help you measure.   Write down your arm measurement in both inches and centimeters.   To measure your blood pressure correctly, it is important to have the right size cuff.   If your arm is up to 13 inches (up to 34 centimeters), get an adult cuff size.  If your arm is 13 to 17 inches (35 to 44 centimeters), get a large adult cuff size.    If your arm is 17 to 20 inches (45 to 52 centimeters), get an adult thigh cuff.  WHAT DO THE NUMBERS MEAN?   There are two numbers that make up your blood pressure. For example: 120/80.  The first number (120 in our example) is called the "systolic pressure." It is a measure of the pressure in your blood vessels when your heart is pumping blood.  The second number (80 in our example) is called the "diastolic pressure." It is a measure of the pressure in your blood vessels when your heart is resting between beats.  Your doctor will tell you what your blood pressure should be. WHAT SHOULD I DO BEFORE I CHECK MY BLOOD PRESSURE?   Try to rest or relax for at least 30 minutes before you check your blood pressure.  Do not smoke.  Do not have any drinks with caffeine, such as:  Soda.  Coffee.  Tea.  Check your blood pressure  in a quiet room.  Sit down and stretch out your arm on a table. Keep your arm at about the level of your heart. Let your arm relax.  Make sure that your legs are not crossed. HOW DO I CHECK MY BLOOD PRESSURE?  Follow the directions that came with your machine.  Make sure you remove any tight-fighting clothing from your arm or wrist. Wrap the cuff around your upper arm or wrist. You should be able to fit a finger between the cuff and your arm. If you cannot fit a finger between the cuff and your arm, it is too tight  and should be removed and rewrapped.  Some units require you to manually pump up the arm cuff.  Automatic units inflate the cuff when you press a button.  Cuff deflation is automatic in both models.  After the cuff is inflated, the unit measures your blood pressure and pulse. The readings are shown on a monitor. Hold still and breathe normally while the cuff is inflated.  Getting a reading takes less than a minute.  Some models store readings in a memory. Some provide a printout of readings. If your machine does not store your readings, keep a written record.  Take readings with you to your next visit with your doctor. Document Released: 11/21/2008 Document Revised: 04/25/2014 Document Reviewed: 02/03/2014 [facility] Patient Information 2015 [place], [place]. This information is not intended to replace advice given to you by your health care provider. Make sure you discuss any questions you have with your health care provider.  Hypertension Hypertension, commonly called high blood pressure, is when the force of blood pumping through your arteries is too strong. Your arteries are the blood vessels that carry blood from your heart throughout your body. A blood pressure reading consists of a higher number over a lower number, such as 110/72. The higher number (systolic) is the pressure inside your arteries when your heart pumps. The lower number (diastolic) is the pressure inside your arteries when your heart relaxes. Ideally you want your blood pressure below 120/80. Hypertension forces your heart to work harder to pump blood. Your arteries may become narrow or stiff. Having hypertension puts you at risk for heart disease, stroke, and other problems.  RISK FACTORS Some risk factors for high blood pressure are controllable. Others are not.  Risk factors you cannot control include:   Race. You may be at higher risk if you are African American.  Age. Risk increases with age.  Gender. Men are at  higher risk than women before age 14 years. After age 66, women are at higher risk than men. Risk factors you can control include:  Not getting enough exercise or physical activity.  Being overweight.  Getting too much fat, sugar, calories, or salt in your diet.  Drinking too much alcohol. SIGNS AND SYMPTOMS Hypertension does not usually cause signs or symptoms. Extremely high blood pressure (hypertensive crisis) may cause headache, anxiety, shortness of breath, and nosebleed. DIAGNOSIS  To check if you have hypertension, your health care provider will measure your blood pressure while you are seated, with your arm held at the level of your heart. It should be measured at least twice using the same arm. Certain conditions can cause a difference in blood pressure between your right and left arms. A blood pressure reading that is higher than normal on one occasion does not mean that you need treatment. If one blood pressure reading is high, ask your health care provider about having  it checked again. TREATMENT  Treating high blood pressure includes making lifestyle changes and possibly taking medicine. Living a healthy lifestyle can help lower high blood pressure. You may need to change some of your habits. Lifestyle changes may include:  Following the DASH diet. This diet is high in fruits, vegetables, and whole grains. It is low in salt, red meat, and added sugars.  Getting at least 2 hours of brisk physical activity every week.  Losing weight if necessary.  Not smoking.  Limiting alcoholic beverages.  Learning ways to reduce stress. If lifestyle changes are not enough to get your blood pressure under control, your health care provider may prescribe medicine. You may need to take more than one. Work closely with your health care provider to understand the risks and benefits. HOME CARE INSTRUCTIONS  Have your blood pressure rechecked as directed by your health care provider.    Take medicines only as directed by your health care provider. Follow the directions carefully. Blood pressure medicines must be taken as prescribed. The medicine does not work as well when you skip doses. Skipping doses also puts you at risk for problems.   Do not smoke.   Monitor your blood pressure at home as directed by your health care provider. SEEK MEDICAL CARE IF:   You think you are having a reaction to medicines taken.  You have recurrent headaches or feel dizzy.  You have swelling in your ankles.  You have trouble with your vision. SEEK IMMEDIATE MEDICAL CARE IF:  You develop a severe headache or confusion.  You have unusual weakness, numbness, or feel faint.  You have severe chest or abdominal pain.  You vomit repeatedly.  You have trouble breathing. MAKE SURE YOU:   Understand these instructions.  Will watch your condition.  Will get help right away if you are not doing well or get worse. Document Released: 12/09/2005 Document Revised: 04/25/2014 Document Reviewed: 10/01/2013 [facility] Patient Information 2015 [place], [place]. This information is not intended to replace advice given to you by your health care provider. Make sure you discuss any questions you have with your health care provider.  Managing Your High Blood Pressure Blood pressure is a measurement of how forceful your blood is pressing against the walls of the arteries. Arteries are muscular tubes within the circulatory system. Blood pressure does not stay the same. Blood pressure rises when you are active, excited, or nervous; and it lowers during sleep and relaxation. If the numbers measuring your blood pressure stay above normal most of the time, you are at risk for health problems. High blood pressure (hypertension) is a long-term (chronic) condition in which blood pressure is elevated. A blood pressure reading is recorded as two numbers, such as 120 over 80 (or 120/80). The first, higher  number is called the systolic pressure. It is a measure of the pressure in your arteries as the heart beats. The second, lower number is called the diastolic pressure. It is a measure of the pressure in your arteries as the heart relaxes between beats.  Keeping your blood pressure in a normal range is important to your overall health and prevention of health problems, such as heart disease and stroke. When your blood pressure is uncontrolled, your heart has to work harder than normal. High blood pressure is a very common condition in adults because blood pressure tends to rise with age. Men and women are equally likely to have hypertension but at different times in life. Before age 97, men  are more likely to have hypertension. After 53 years of age, women are more likely to have it. Hypertension is especially common in African Americans. This condition often has no signs or symptoms. The cause of the condition is usually not known. Your caregiver can help you come up with a plan to keep your blood pressure in a normal, healthy range. BLOOD PRESSURE STAGES Blood pressure is classified into four stages: normal, prehypertension, stage 1, and stage 2. Your blood pressure reading will be used to determine what type of treatment, if any, is necessary. Appropriate treatment options are tied to these four stages:  Normal  Systolic pressure (mm Hg): below 120.  Diastolic pressure (mm Hg): below 80. Prehypertension  Systolic pressure (mm Hg): 120 to 139.  Diastolic pressure (mm Hg): 80 to 89. Stage1  Systolic pressure (mm Hg): 140 to 159.  Diastolic pressure (mm Hg): 90 to 99. Stage2  Systolic pressure (mm Hg): 160 or above.  Diastolic pressure (mm Hg): 100 or above. RISKS RELATED TO HIGH BLOOD PRESSURE Managing your blood pressure is an important responsibility. Uncontrolled high blood pressure can lead to:  A heart attack.  A stroke.  A weakened blood vessel (aneurysm).  Heart  failure.  Kidney damage.  Eye damage.  Metabolic syndrome.  Memory and concentration problems. HOW TO MANAGE YOUR BLOOD PRESSURE Blood pressure can be managed effectively with lifestyle changes and medicines (if needed). Your caregiver will help you come up with a plan to bring your blood pressure within a normal range. Your plan should include the following: Education  Read all information provided by your caregivers about how to control blood pressure.  Educate yourself on the latest guidelines and treatment recommendations. New research is always being done to further define the risks and treatments for high blood pressure. Lifestylechanges  Control your weight.  Avoid smoking.  Stay physically active.  Reduce the amount of salt in your diet.  Reduce stress.  Control any chronic conditions, such as high cholesterol or diabetes.  Reduce your alcohol intake. Medicines  Several medicines (antihypertensive medicines) are available, if needed, to bring blood pressure within a normal range. Communication  Review all the medicines you take with your caregiver because there may be side effects or interactions.  Talk with your caregiver about your diet, exercise habits, and other lifestyle factors that may be contributing to high blood pressure.  See your caregiver regularly. Your caregiver can help you create and adjust your plan for managing high blood pressure. RECOMMENDATIONS FOR TREATMENT AND FOLLOW-UP  The following recommendations are based on current guidelines for managing high blood pressure in nonpregnant adults. Use these recommendations to identify the proper follow-up period or treatment option based on your blood pressure reading. You can discuss these options with your caregiver.  Systolic pressure of 120 to 139 or diastolic pressure of 80 to 89: Follow up with your caregiver as directed.  Systolic pressure of 140 to 160 or diastolic pressure of 90 to 100:  Follow up with your caregiver within 2 months.  Systolic pressure above 160 or diastolic pressure above 100: Follow up with your caregiver within 1 month.  Systolic pressure above 180 or diastolic pressure above 110: Consider antihypertensive therapy; follow up with your caregiver within 1 week.  Systolic pressure above 200 or diastolic pressure above 120: Begin antihypertensive therapy; follow up with your caregiver within 1 week. Document Released: 09/02/2012 Document Reviewed: 09/02/2012 [facility] Patient Information 2015 [place], [place]. This information is not intended to replace  advice given to you by your health care provider. Make sure you discuss any questions you have with your health care provider. ° °

## 2015-01-30 ENCOUNTER — Other Ambulatory Visit (HOSPITAL_COMMUNITY): Payer: Self-pay | Admitting: Family Medicine

## 2015-01-30 DIAGNOSIS — I10 Essential (primary) hypertension: Secondary | ICD-10-CM

## 2015-01-31 NOTE — Progress Notes (Signed)
Late entry - SLP evaluation addendum    01/27/15 1200  SLP G-Codes **NOT FOR INPATIENT CLASS**  Functional Assessment Tool Used skilled clinical judgment  Functional Limitations Swallowing  Swallow Current Status (Z6109(G8996) CI  Swallow Goal Status (U0454(G8997) CI  Swallow Discharge Status (U9811(G8998) CI  SLP Evaluations  $ SLP Speech Visit 1 Procedure  SLP Evaluations  $BSS Swallow 1 Procedure     Maxcine HamLaura Paiewonsky, M.A. CCC-SLP [phone]

## 2015-02-01 NOTE — Progress Notes (Signed)
Late entry for missed gcode 01/27/15:   02/01/15 0800  PT G-Codes **NOT FOR INPATIENT CLASS**  Functional Assessment Tool Used clinical judgment  Functional Limitation Mobility: Walking and moving around  Mobility: Walking and Moving Around Current Status (Z6109(G8978) CI  Mobility: Walking and Moving Around Goal Status [phone](G8979) CI  Mobility: Walking and Moving Around Discharge Status [phone](G8980) CI  [facility] LPDawn Yunus [place],PT Acute Rehabilitation [phone])

## 2015-02-03 ENCOUNTER — Ambulatory Visit (HOSPITAL_COMMUNITY)
Admission: RE | Admit: 2015-02-03 | Discharge: 2015-02-03 | Disposition: A | Discharge status: Home / Self Care | Payer: Medicaid Other | Source: Ambulatory Visit | Attending: Cardiovascular Disease | Admitting: Cardiovascular Disease

## 2015-02-03 ENCOUNTER — Ambulatory Visit (HOSPITAL_COMMUNITY): Payer: Medicaid Other

## 2015-02-03 DIAGNOSIS — R531 Weakness: Secondary | ICD-10-CM | POA: Diagnosis not present

## 2015-02-03 DIAGNOSIS — R200 Anesthesia of skin: Secondary | ICD-10-CM | POA: Insufficient documentation

## 2015-02-03 DIAGNOSIS — I10 Essential (primary) hypertension: Secondary | ICD-10-CM | POA: Diagnosis present

## 2015-02-03 DIAGNOSIS — R4781 Slurred speech: Secondary | ICD-10-CM | POA: Insufficient documentation

## 2015-02-03 DIAGNOSIS — G458 Other transient cerebral ischemic attacks and related syndromes: Secondary | ICD-10-CM

## 2015-02-03 NOTE — Progress Notes (Signed)
Carotid Duplex Completed. Normal carotid study with no evidence of stenosis. Micah Barnier, BS, RDMS, RVT  

## 2015-02-06 NOTE — Progress Notes (Signed)
Late entry for 01/27/15 for missed gcode:   02/06/15 1249  PT G-Codes **NOT FOR INPATIENT CLASS**  Functional Assessment Tool Used clinical judgment  Functional Limitation Mobility: Walking and moving around  Mobility: Walking and Moving Around Current Status [phone](G8978) CI  Mobility: Walking and Moving Around Goal Status [phone](G8979) CI  Mobility: Walking and Moving Around Discharge Status [phone](G8980) CI  [facility]enter[facility]wn Yuko [place],PT Acute Rehabilitation [phone])

## 2015-02-07 ENCOUNTER — Telehealth (HOSPITAL_COMMUNITY): Payer: Self-pay | Admitting: *Deleted

## 2016-12-02 IMAGING — CT CT HEAD W/O CM
2 series · 16 of 30 positions shown, 19 images · non-contrast
Comparison: CT of the paranasal sinuses performed 10/17/2005

CLINICAL DATA: Code stroke. Right-sided weakness, acute onset.
Initial encounter.

EXAM:
CT HEAD WITHOUT CONTRAST
TECHNIQUE: Contiguous axial images were obtained from the base of the skull
through the vertex without intravenous contrast.

[Series 2: head w/o · axial · non-contrast · 0.42mm/px · z∈[-180,-60]mm · 9 of 32 slices shown, 12 images]
[im 4/32  brain]
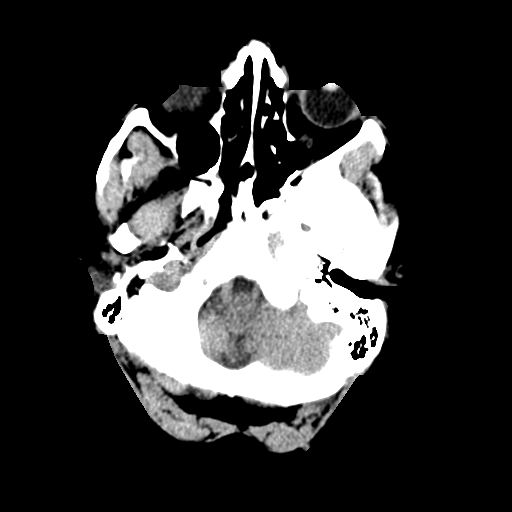
[im 4/32  bone]
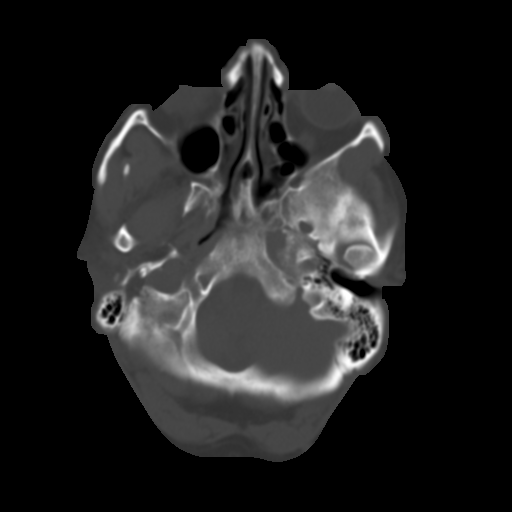
[im 7/32  brain]
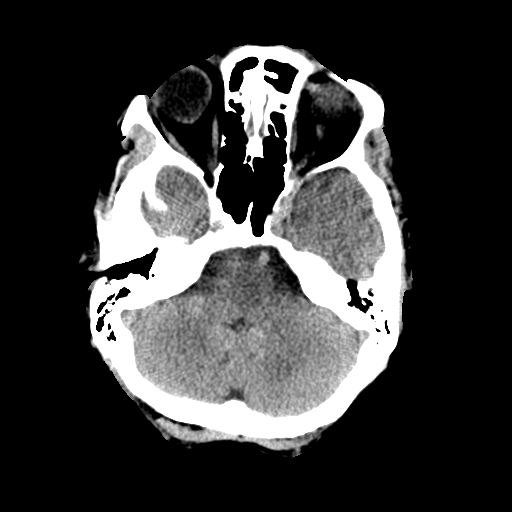
[im 10/32  brain]
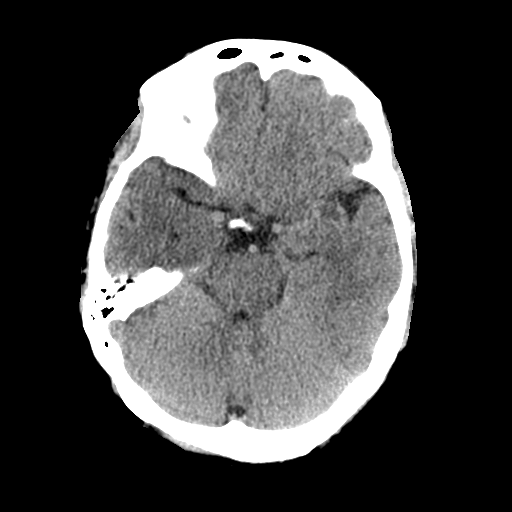
[im 13/32  brain]
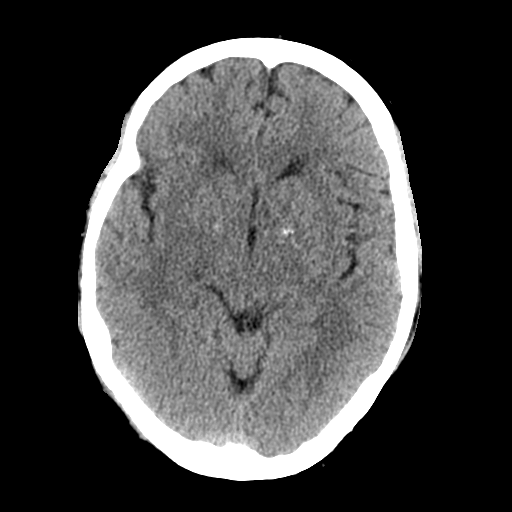
[im 16/32  brain]
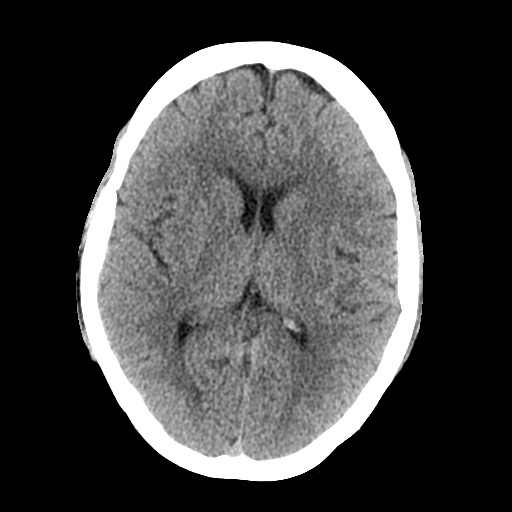
[im 16/32  bone]
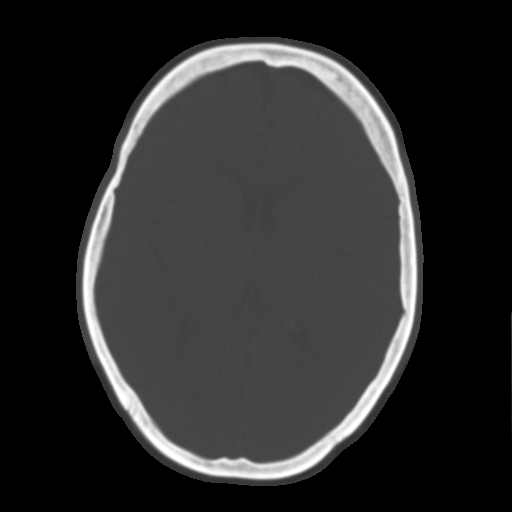
[im 19/32  brain]
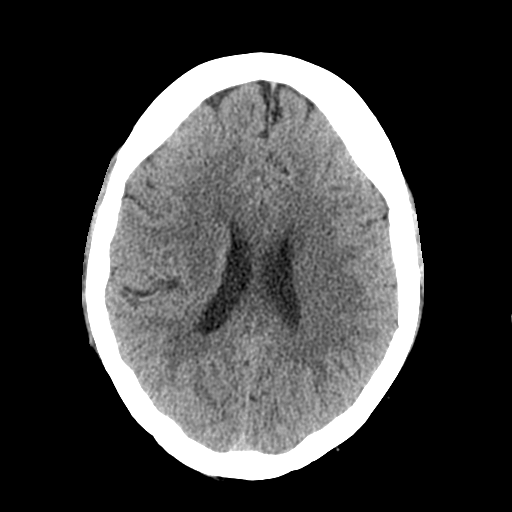
[im 22/32  brain]
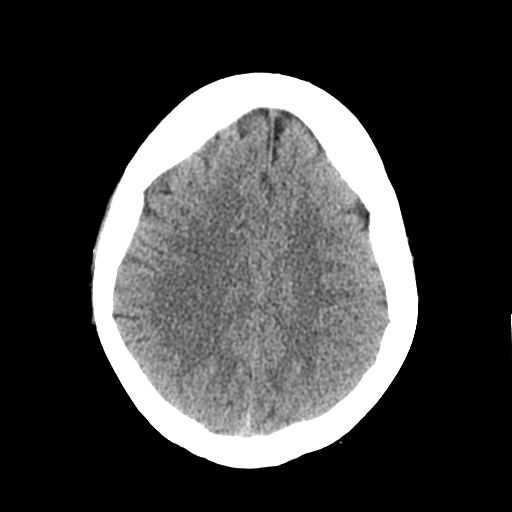
[im 25/32  brain]
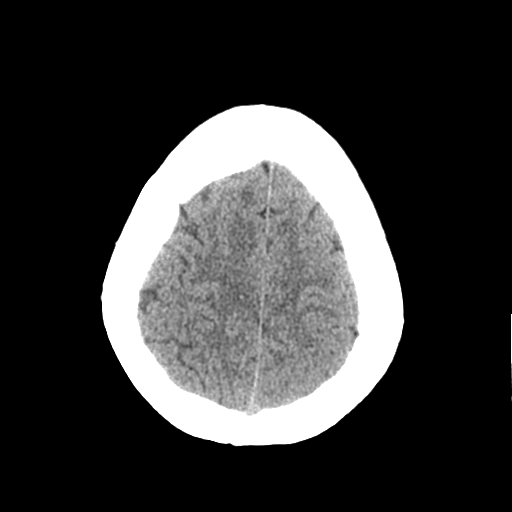
[im 28/32  brain]
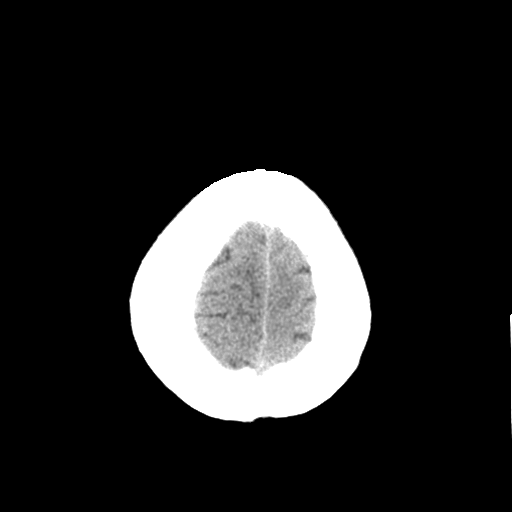
[im 28/32  bone]
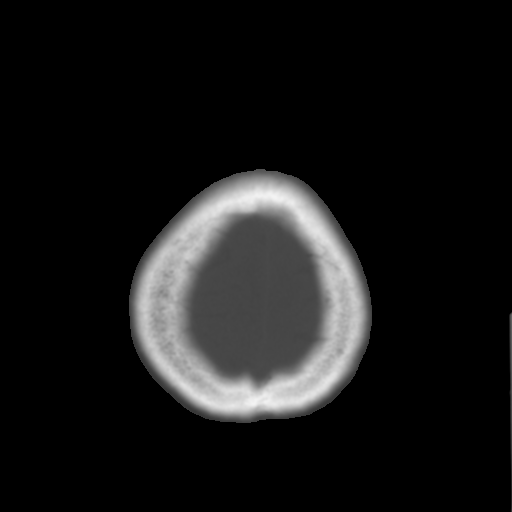

[Series 3: bone windows · axial · 0.42mm/px · z∈[-180,-78]mm · 7 of 52 slices shown]
[im 6/52  bone]
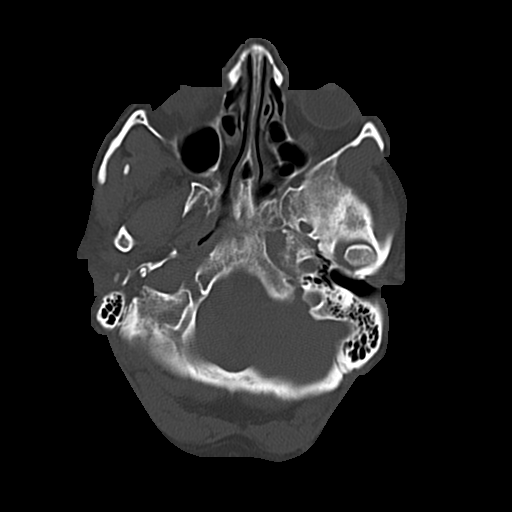
[im 12/52  bone]
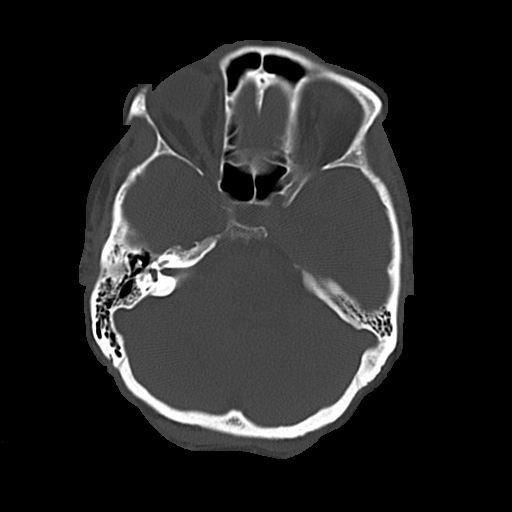
[im 18/52  bone]
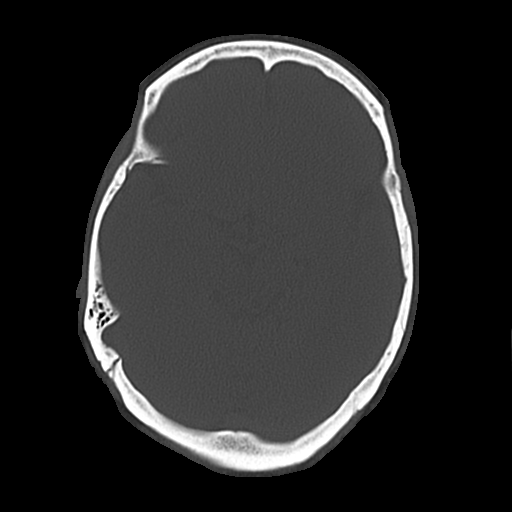
[im 23/52  bone]
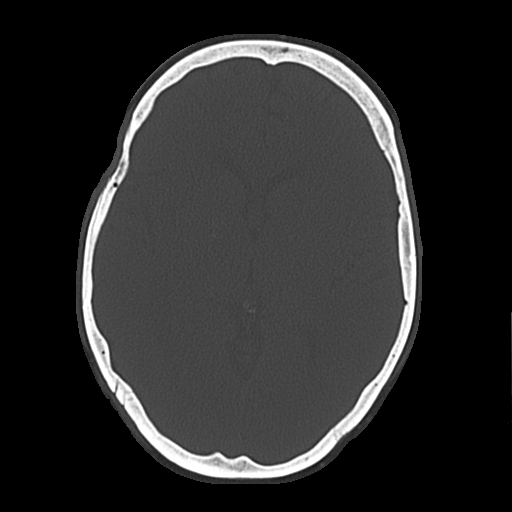
[im 29/52  bone]
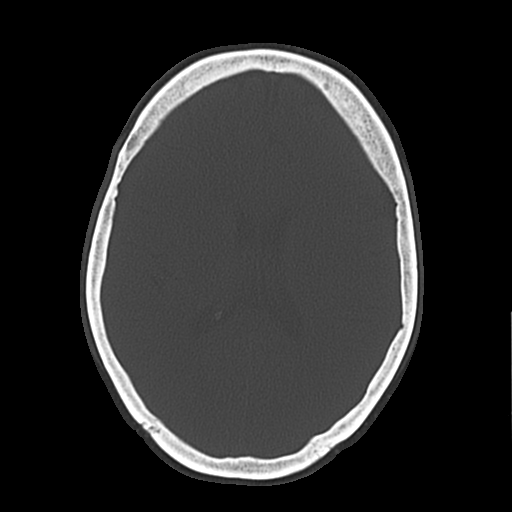
[im 35/52  bone]
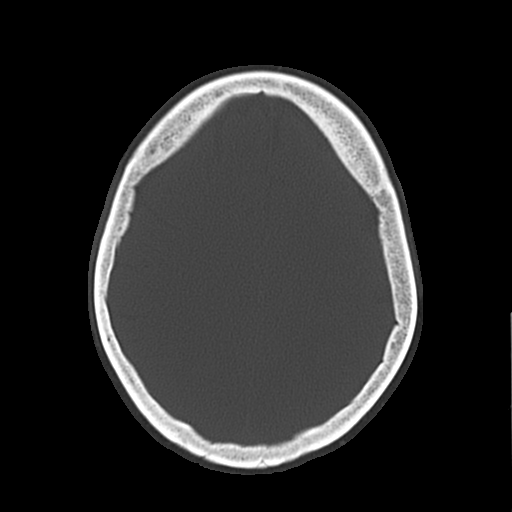
[im 40/52  bone]
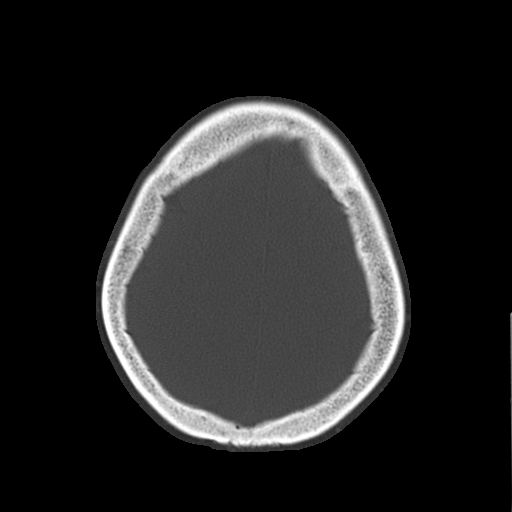

[16 of 30 positions shown; findings below may reference images not displayed]

FINDINGS: There is no evidence of acute infarction, mass lesion, or intra- or
extra-axial hemorrhage on CT.

Mild periventricular white matter change likely reflects small
vessel ischemic microangiopathy.

The posterior fossa, including the cerebellum, brainstem and fourth
ventricle, is within normal limits. The third and lateral
ventricles, and basal ganglia are unremarkable in appearance. The
cerebral hemispheres are symmetric in appearance, with normal
gray-white differentiation. No mass effect or midline shift is seen.

There is no evidence of fracture; visualized osseous structures are
unremarkable in appearance. The orbits are within normal limits. The
paranasal sinuses and mastoid air cells are well-aerated. No
significant soft tissue abnormalities are seen.
IMPRESSION: 1. No acute intracranial pathology seen on CT.
2. Mild small vessel ischemic microangiopathy noted.

These results were called by telephone at the time of interpretation
on 01/26/2015 at [DATE] to Dr. ZEINAB TIGER, who verbally
acknowledged these results.

## 2016-12-02 IMAGING — CR DG CHEST 2V
2 series · 2 of 2 positions shown · non-contrast
Comparison: None.

CLINICAL DATA: 53-year-old female with a history of right arm
numbness and chest pain this morning. Possible TIA.

EXAM:
CHEST - 2 VIEW

[chest pa]
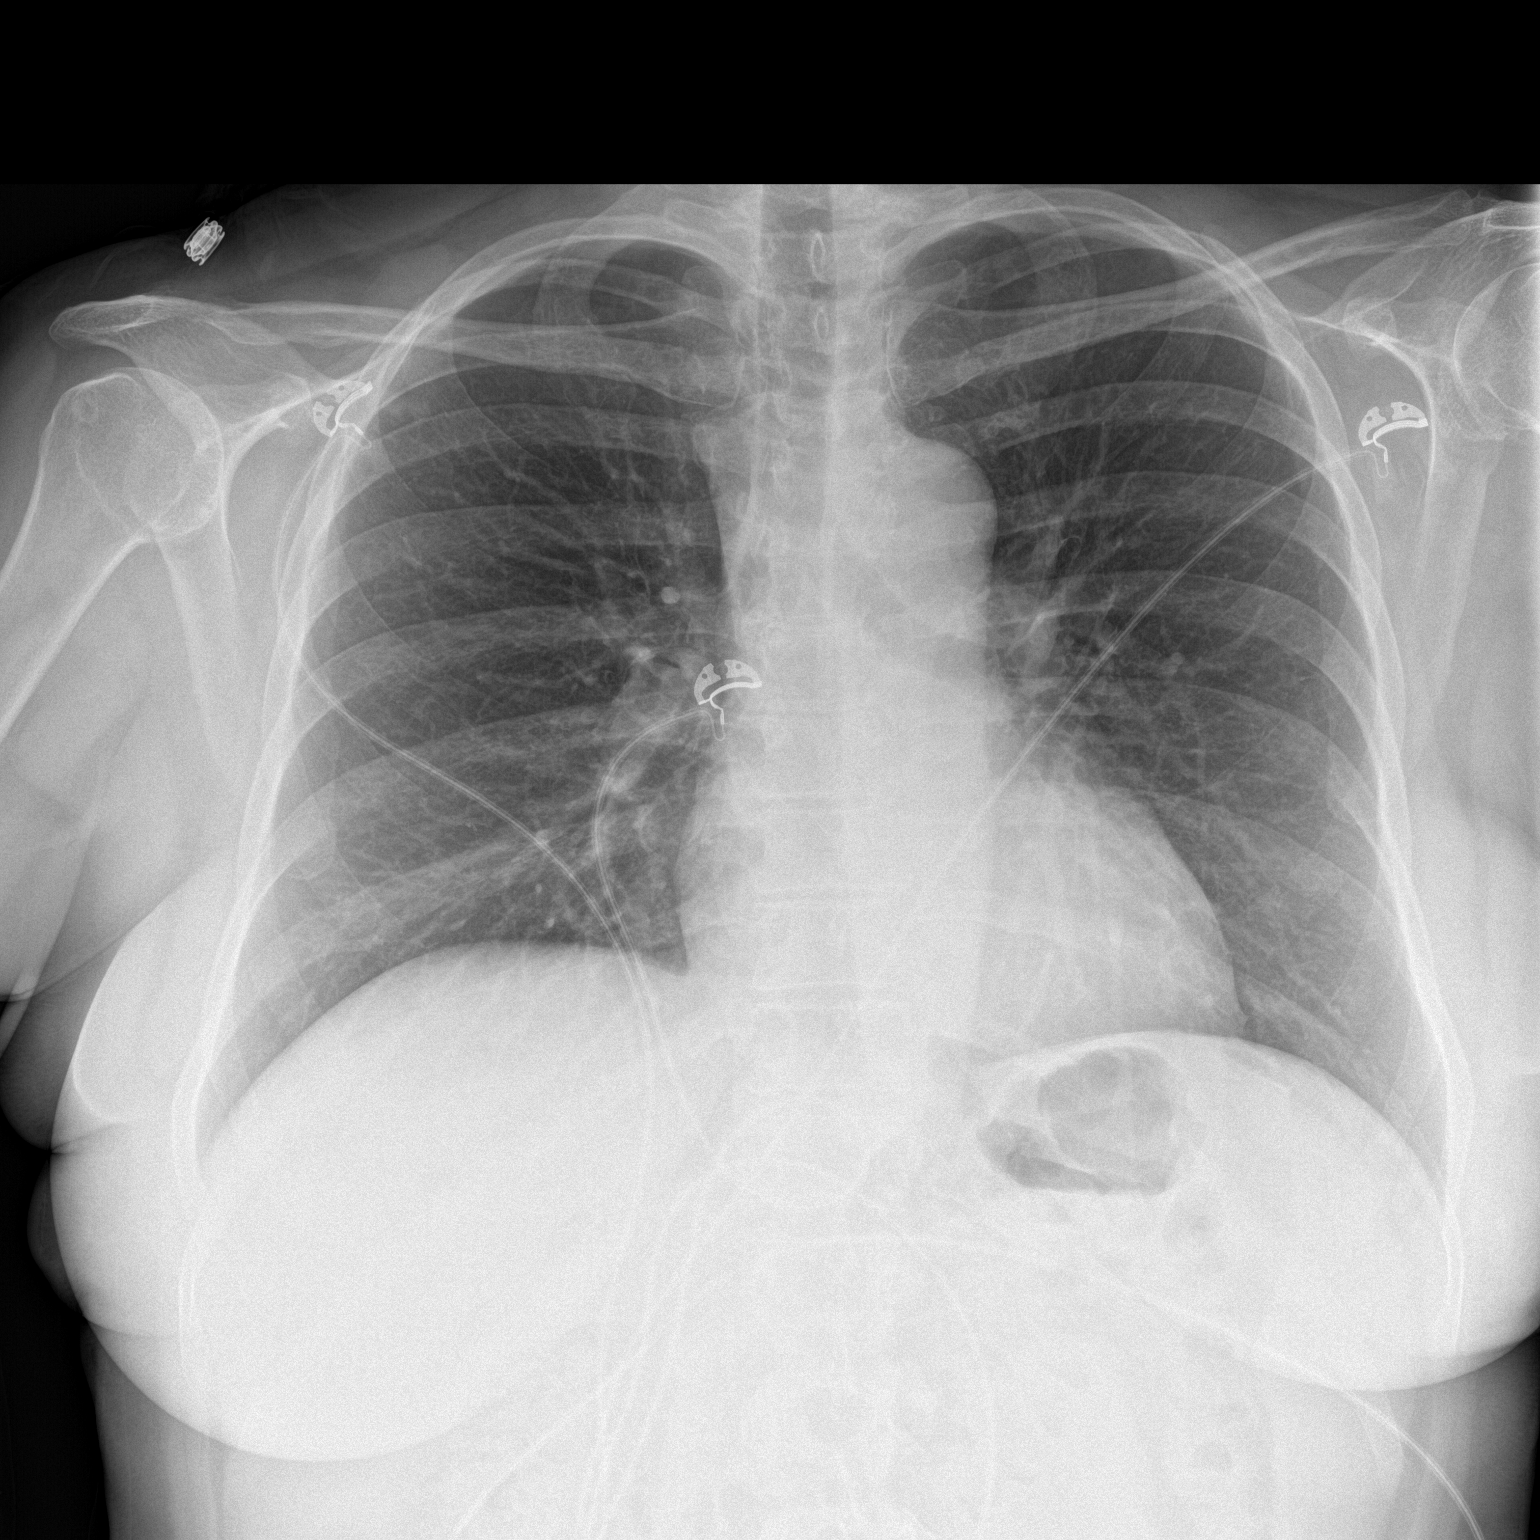

[chest lat]
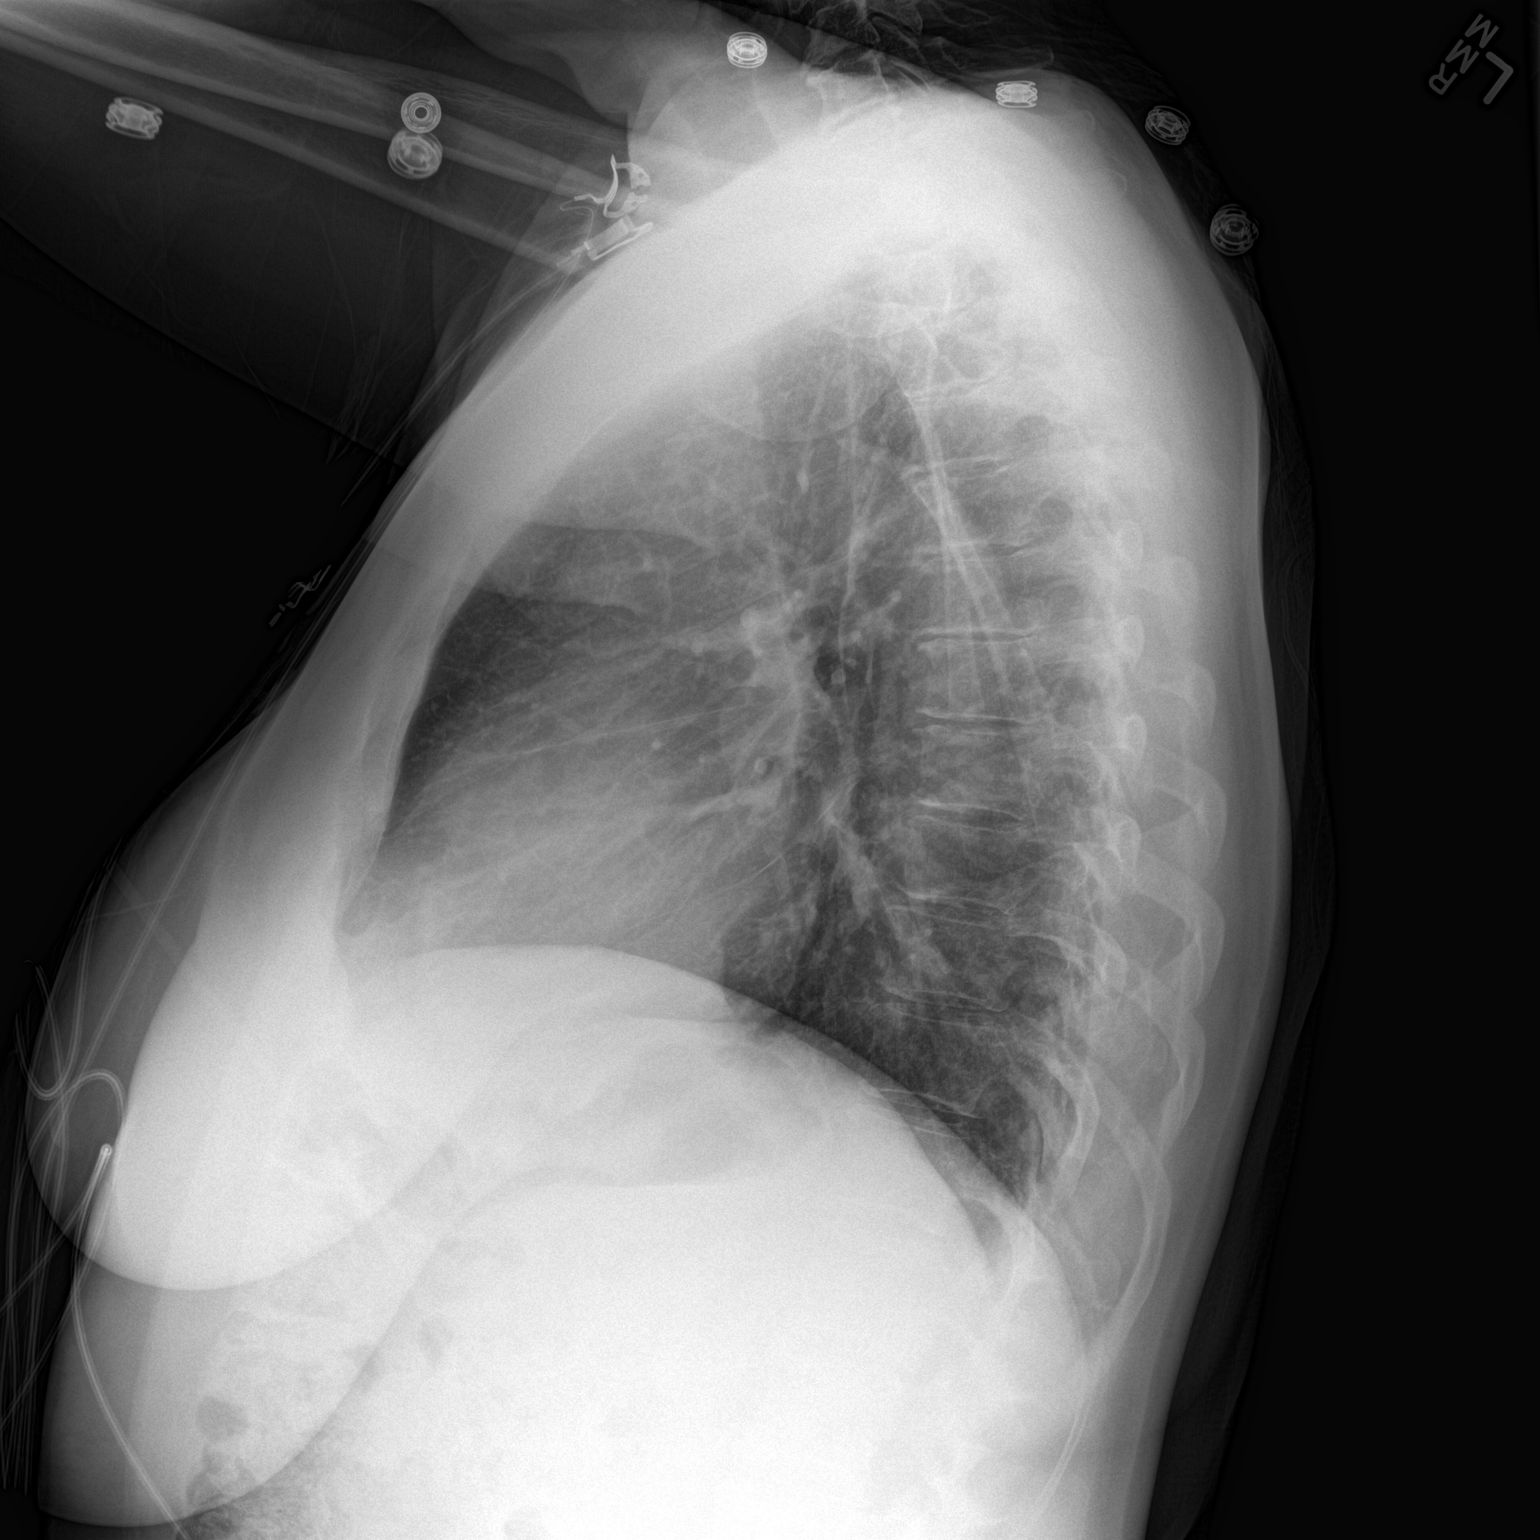

[2 of 2 positions shown; findings below may reference images not displayed]

FINDINGS: Cardiomediastinal silhouette projects within normal limits in size
and contour. No confluent airspace disease, pneumothorax, or pleural
effusion.

No displaced fracture.

Unremarkable appearance of the upper abdomen.
IMPRESSION: No radiographic evidence of acute cardiopulmonary disease.
